# Patient Record
Sex: Male | Born: 1950 | Race: White | Hispanic: No | Marital: Married | Smoking: Current some day smoker
Health system: Southern US, Community
[De-identification: ages and names within clinical notes are randomized; demographics above are authoritative.]

## PROBLEM LIST (undated history)

## (undated) DIAGNOSIS — K5790 Diverticulosis of intestine, part unspecified, without perforation or abscess without bleeding: Secondary | ICD-10-CM

## (undated) DIAGNOSIS — T7840XA Allergy, unspecified, initial encounter: Secondary | ICD-10-CM

## (undated) DIAGNOSIS — I10 Essential (primary) hypertension: Secondary | ICD-10-CM

## (undated) DIAGNOSIS — H269 Unspecified cataract: Secondary | ICD-10-CM

## (undated) DIAGNOSIS — D126 Benign neoplasm of colon, unspecified: Secondary | ICD-10-CM

## (undated) DIAGNOSIS — K219 Gastro-esophageal reflux disease without esophagitis: Secondary | ICD-10-CM

## (undated) DIAGNOSIS — R739 Hyperglycemia, unspecified: Secondary | ICD-10-CM

## (undated) DIAGNOSIS — K222 Esophageal obstruction: Secondary | ICD-10-CM

## (undated) DIAGNOSIS — K449 Diaphragmatic hernia without obstruction or gangrene: Secondary | ICD-10-CM

## (undated) HISTORY — DX: Essential (primary) hypertension: I10

## (undated) HISTORY — PX: CHOLECYSTECTOMY: SHX55

## (undated) HISTORY — PX: RETINAL DETACHMENT SURGERY: SHX105

## (undated) HISTORY — DX: Unspecified cataract: H26.9

## (undated) HISTORY — DX: Gastro-esophageal reflux disease without esophagitis: K21.9

## (undated) HISTORY — DX: Diaphragmatic hernia without obstruction or gangrene: K44.9

## (undated) HISTORY — DX: Allergy, unspecified, initial encounter: T78.40XA

## (undated) HISTORY — PX: COLONOSCOPY W/ POLYPECTOMY: SHX1380

## (undated) HISTORY — DX: Diverticulosis of intestine, part unspecified, without perforation or abscess without bleeding: K57.90

## (undated) HISTORY — DX: Esophageal obstruction: K22.2

## (undated) HISTORY — PX: CATARACT EXTRACTION, BILATERAL: SHX1313

## (undated) HISTORY — DX: Hyperglycemia, unspecified: R73.9

---

## 2000-10-01 ENCOUNTER — Encounter (INDEPENDENT_AMBULATORY_CARE_PROVIDER_SITE_OTHER): Payer: Self-pay | Admitting: Specialist

## 2000-10-01 ENCOUNTER — Other Ambulatory Visit: Admission: RE | Admit: 2000-10-01 | Discharge: 2000-10-01 | Payer: Self-pay | Admitting: Internal Medicine

## 2004-03-09 HISTORY — PX: CHOLECYSTECTOMY, LAPAROSCOPIC: SHX56

## 2004-06-18 ENCOUNTER — Ambulatory Visit: Payer: Self-pay | Admitting: Internal Medicine

## 2004-11-13 ENCOUNTER — Ambulatory Visit: Payer: Self-pay | Admitting: Internal Medicine

## 2004-12-01 ENCOUNTER — Encounter: Admission: RE | Admit: 2004-12-01 | Discharge: 2004-12-01 | Payer: Self-pay | Admitting: Internal Medicine

## 2005-01-01 ENCOUNTER — Encounter (INDEPENDENT_AMBULATORY_CARE_PROVIDER_SITE_OTHER): Payer: Self-pay | Admitting: Specialist

## 2005-01-01 ENCOUNTER — Ambulatory Visit: Admission: RE | Admit: 2005-01-01 | Discharge: 2005-01-01 | Payer: Self-pay | Admitting: Surgery

## 2005-01-13 ENCOUNTER — Ambulatory Visit: Payer: Self-pay | Admitting: Internal Medicine

## 2005-03-09 HISTORY — PX: UPPER GASTROINTESTINAL ENDOSCOPY: SHX188

## 2005-04-07 ENCOUNTER — Ambulatory Visit: Payer: Self-pay | Admitting: Internal Medicine

## 2005-04-21 ENCOUNTER — Ambulatory Visit: Payer: Self-pay | Admitting: Internal Medicine

## 2005-06-09 ENCOUNTER — Ambulatory Visit: Payer: Self-pay | Admitting: Internal Medicine

## 2005-07-13 ENCOUNTER — Ambulatory Visit: Payer: Self-pay | Admitting: Internal Medicine

## 2005-09-04 ENCOUNTER — Ambulatory Visit: Payer: Self-pay | Admitting: Internal Medicine

## 2005-10-06 ENCOUNTER — Ambulatory Visit: Payer: Self-pay | Admitting: Internal Medicine

## 2006-01-08 ENCOUNTER — Ambulatory Visit: Payer: Self-pay | Admitting: Internal Medicine

## 2006-01-25 ENCOUNTER — Ambulatory Visit: Payer: Self-pay | Admitting: Internal Medicine

## 2006-02-12 ENCOUNTER — Ambulatory Visit: Payer: Self-pay | Admitting: Internal Medicine

## 2006-07-25 IMAGING — NM NM HEPATO W/GB/PHARM/[PERSON_NAME]
6 series · 11 of 11 positions shown · non-contrast
Comparison: None.

CLINICAL DATA: Gallstones and abdominal pain.  Possible gallbladder dysfunction. 
 NUCLEAR MEDICINE HEPATOBILIARY SCAN WITH EJECTION FRACTION:
TECHNIQUE: Sequential abdominal images were obtained following intravenous injection of radiopharmaceutical.  Sequential images were continued following oral ingestion of 8 oz. half-and-half, and the gallbladder ejection fraction was calculated.
 Radiopharmaceutical:  5 mCi Xc-ZZm Choletec

[gb hepatobiliary · 1 of 1 slices shown (1 of 6)]
[im 1/1]
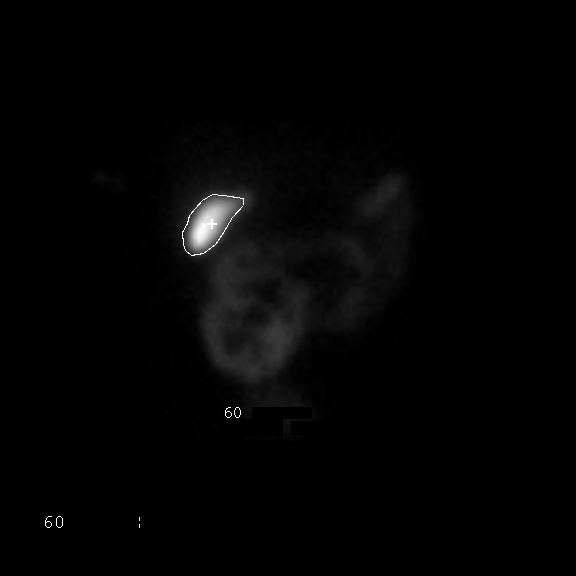

[gb hepatobiliary · 4.66mm/px · 6 of 12 frames shown (2 of 6)]
[frame 2/12]
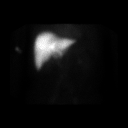
[frame 4/12]
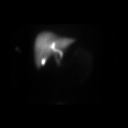
[frame 6/12]
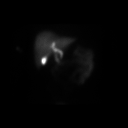
[frame 8/12]
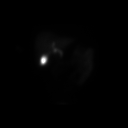
[frame 10/12]
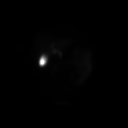
[frame 12/12]
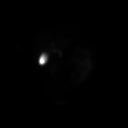

[gb hepatobiliary · 1 of 1 slices shown (3 of 6)]
[im 1/1]
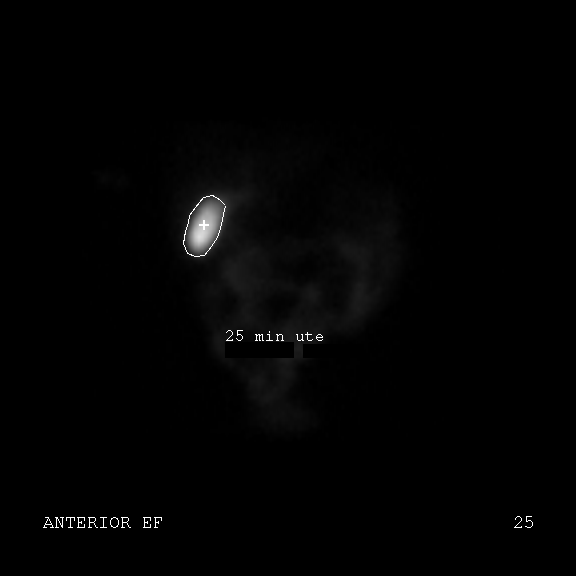

[gb hepatobiliary · 1 of 1 slices shown (4 of 6)]
[im 1/1]
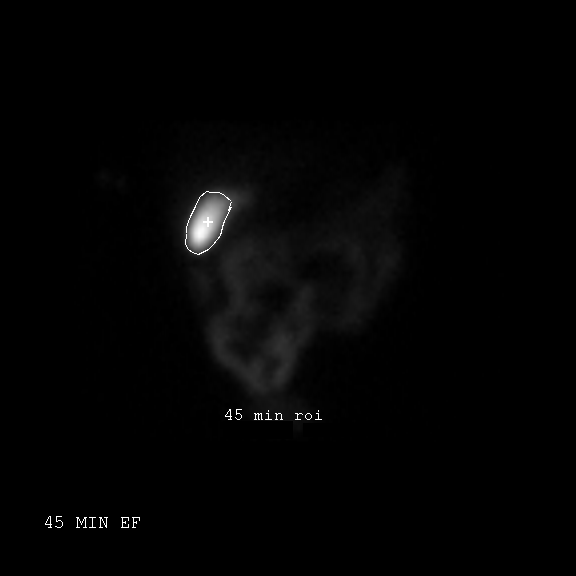

[gb hepatobiliary · 1 of 1 slices shown (5 of 6)]
[im 1/1]
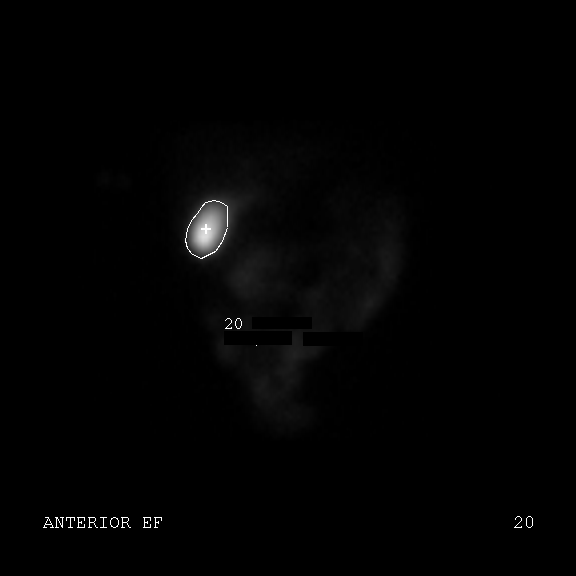

[gb hepatobiliary · 1 of 1 slices shown (6 of 6)]
[im 1/1]
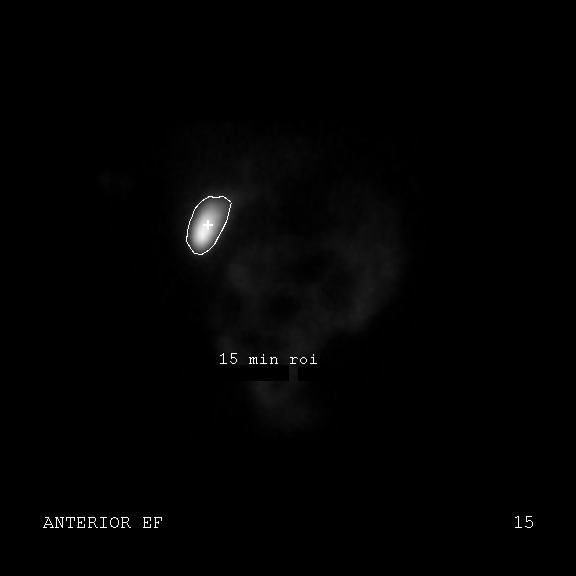

[11 of 11 positions shown; findings below may reference images not displayed]

FINDINGS: There is prompt uptake of tracer by the liver.  Common duct activity is seen at 10 minutes, gallbladder activity at 15 minutes, and bile activity by 20 minutes.  
 Evaluation of ejection fraction after half-and-half administration demonstrates an ejection fraction of 29 percent.
IMPRESSION: 1.  Mildly decreased ejection fraction at 29 percent (lower limits of normal 35 percent).  This suggests a component of chronic cholecystitis. 
 2.  No evidence of acute cholecystitis.

## 2007-07-04 ENCOUNTER — Ambulatory Visit: Payer: Self-pay | Admitting: Internal Medicine

## 2007-07-04 DIAGNOSIS — Z860100 Personal history of colon polyps, unspecified: Secondary | ICD-10-CM | POA: Insufficient documentation

## 2007-07-04 DIAGNOSIS — H268 Other specified cataract: Secondary | ICD-10-CM | POA: Insufficient documentation

## 2007-07-04 DIAGNOSIS — K219 Gastro-esophageal reflux disease without esophagitis: Secondary | ICD-10-CM

## 2007-08-26 ENCOUNTER — Telehealth (INDEPENDENT_AMBULATORY_CARE_PROVIDER_SITE_OTHER): Payer: Self-pay | Admitting: *Deleted

## 2008-06-05 ENCOUNTER — Telehealth: Payer: Self-pay | Admitting: Internal Medicine

## 2008-11-07 DIAGNOSIS — R739 Hyperglycemia, unspecified: Secondary | ICD-10-CM

## 2008-11-07 HISTORY — DX: Hyperglycemia, unspecified: R73.9

## 2008-11-23 ENCOUNTER — Ambulatory Visit: Payer: Self-pay | Admitting: Internal Medicine

## 2008-11-23 DIAGNOSIS — N400 Enlarged prostate without lower urinary tract symptoms: Secondary | ICD-10-CM

## 2008-11-23 DIAGNOSIS — K5730 Diverticulosis of large intestine without perforation or abscess without bleeding: Secondary | ICD-10-CM | POA: Insufficient documentation

## 2008-11-23 DIAGNOSIS — J019 Acute sinusitis, unspecified: Secondary | ICD-10-CM

## 2008-11-28 ENCOUNTER — Encounter (INDEPENDENT_AMBULATORY_CARE_PROVIDER_SITE_OTHER): Payer: Self-pay | Admitting: *Deleted

## 2008-12-03 ENCOUNTER — Encounter (INDEPENDENT_AMBULATORY_CARE_PROVIDER_SITE_OTHER): Payer: Self-pay | Admitting: *Deleted

## 2010-03-30 ENCOUNTER — Encounter: Payer: Self-pay | Admitting: Internal Medicine

## 2010-04-06 LAB — CONVERTED CEMR LAB
ALT: 19 units/L (ref 0–53)
ALT: 20 units/L (ref 0–53)
AST: 23 units/L (ref 0–37)
AST: 25 units/L (ref 0–37)
Albumin: 4.3 g/dL (ref 3.5–5.2)
Albumin: 4.4 g/dL (ref 3.5–5.2)
Alkaline Phosphatase: 40 units/L (ref 39–117)
Alkaline Phosphatase: 43 units/L (ref 39–117)
BUN: 10 mg/dL (ref 6–23)
BUN: 15 mg/dL (ref 6–23)
Basophils Absolute: 0 10*3/uL (ref 0.0–0.1)
Basophils Absolute: 0 10*3/uL (ref 0.0–0.1)
Basophils Relative: 0.1 % (ref 0.0–1.0)
Basophils Relative: 0.3 % (ref 0.0–3.0)
Bilirubin, Direct: 0 mg/dL (ref 0.0–0.3)
Bilirubin, Direct: 0.1 mg/dL (ref 0.0–0.3)
CO2: 27 meq/L (ref 19–32)
CO2: 29 meq/L (ref 19–32)
Calcium: 9.4 mg/dL (ref 8.4–10.5)
Calcium: 9.6 mg/dL (ref 8.4–10.5)
Chloride: 103 meq/L (ref 96–112)
Chloride: 103 meq/L (ref 96–112)
Cholesterol: 171 mg/dL (ref 0–200)
Cholesterol: 205 mg/dL (ref 0–200)
Creatinine, Ser: 1 mg/dL (ref 0.4–1.5)
Creatinine, Ser: 1 mg/dL (ref 0.4–1.5)
Direct LDL: 103.7 mg/dL
Eosinophils Absolute: 0.1 10*3/uL (ref 0.0–0.7)
Eosinophils Absolute: 0.1 10*3/uL (ref 0.0–0.7)
Eosinophils Relative: 1 % (ref 0.0–5.0)
Eosinophils Relative: 1.4 % (ref 0.0–5.0)
GFR calc Af Amer: 100 mL/min
GFR calc non Af Amer: 81.76 mL/min (ref >60)
GFR calc non Af Amer: 82 mL/min
Glucose, Bld: 107 mg/dL — ABNORMAL HIGH (ref 70–99)
Glucose, Bld: 92 mg/dL (ref 70–99)
HCT: 41.3 % (ref 39.0–52.0)
HCT: 42.6 % (ref 39.0–52.0)
HDL: 84 mg/dL (ref >39.00)
HDL: 87.4 mg/dL (ref >39.0)
Hemoglobin: 14.1 g/dL (ref 13.0–17.0)
Hemoglobin: 14.5 g/dL (ref 13.0–17.0)
Hgb A1c MFr Bld: 5.2 % (ref 4.6–6.0)
Hgb A1c MFr Bld: 5.2 % (ref 4.6–6.5)
LDL Cholesterol: 80 mg/dL (ref 0–99)
Lymphocytes Relative: 25.1 % (ref 12.0–46.0)
Lymphocytes Relative: 30 % (ref 12.0–46.0)
Lymphs Abs: 1.5 10*3/uL (ref 0.7–4.0)
MCHC: 34.1 g/dL (ref 30.0–36.0)
MCHC: 34.2 g/dL (ref 30.0–36.0)
MCV: 100.6 fL — ABNORMAL HIGH (ref 78.0–100.0)
MCV: 101.9 fL — ABNORMAL HIGH (ref 78.0–100.0)
Monocytes Absolute: 0.6 10*3/uL (ref 0.1–1.0)
Monocytes Absolute: 0.8 10*3/uL (ref 0.1–1.0)
Monocytes Relative: 10.7 % (ref 3.0–12.0)
Monocytes Relative: 11.9 % (ref 3.0–12.0)
Neutro Abs: 3.6 10*3/uL (ref 1.4–7.7)
Neutro Abs: 3.6 10*3/uL (ref 1.4–7.7)
Neutrophils Relative %: 57 % (ref 43.0–77.0)
Neutrophils Relative %: 62.5 % (ref 43.0–77.0)
PSA: 1.91 ng/mL (ref 0.10–4.00)
PSA: 1.91 ng/mL (ref 0.10–4.00)
Platelets: 267 10*3/uL (ref 150.0–400.0)
Platelets: 290 10*3/uL (ref 150–400)
Potassium: 4.6 meq/L (ref 3.5–5.1)
Potassium: 4.8 meq/L (ref 3.5–5.1)
RBC: 4.11 M/uL — ABNORMAL LOW (ref 4.22–5.81)
RBC: 4.18 M/uL — ABNORMAL LOW (ref 4.22–5.81)
RDW: 11.7 % (ref 11.5–14.6)
RDW: 12 % (ref 11.5–14.6)
Sodium: 136 meq/L (ref 135–145)
Sodium: 136 meq/L (ref 135–145)
TSH: 1.31 microintl units/mL (ref 0.35–5.50)
TSH: 1.62 microintl units/mL (ref 0.35–5.50)
Total Bilirubin: 0.8 mg/dL (ref 0.3–1.2)
Total Bilirubin: 1.2 mg/dL (ref 0.3–1.2)
Total CHOL/HDL Ratio: 2
Total CHOL/HDL Ratio: 2.3
Total Protein: 7.4 g/dL (ref 6.0–8.3)
Total Protein: 7.6 g/dL (ref 6.0–8.3)
Triglycerides: 36 mg/dL (ref 0.0–149.0)
Triglycerides: 55 mg/dL (ref 0–149)
VLDL: 11 mg/dL (ref 0–40)
VLDL: 7.2 mg/dL (ref 0.0–40.0)
WBC: 5.8 10*3/uL (ref 4.5–10.5)
WBC: 6.4 10*3/uL (ref 4.5–10.5)

## 2010-07-25 NOTE — Op Note (Signed)
NAMEBRILEY, Philip Castillo NO.:  [id number]   MEDICAL RECORD NO.:  [id number]          PATIENT TYPE:  OIB   LOCATION:  2550                         FACILITY:  [facility]   PHYSICIAN:  Currie Paris, M.D.DATE OF BIRTH:  1950-09-04   DATE OF PROCEDURE:  01/01/2005  DATE OF DISCHARGE:                                 OPERATIVE REPORT   OFFICE MEDICAL RECORD NUMBER:  [id number].   PREOPERATIVE DIAGNOSIS:  Chronic calculus cholecystitis with biliary colic.   POSTOPERATIVE DIAGNOSIS:  Chronic calculus cholecystitis with biliary colic.   OPERATION:  Laparoscopic cholecystectomy with intraoperative cholangiogram.   SURGEON:  Dr. Jamey Ripa.   ASSISTANT:  Dr. Maple Hudson.   ANESTHESIA:  General endotracheal.   CLINICAL HISTORY:  This is a 60 year old gentleman with increasing biliary  symptoms and history of gallstones and a recent hepatobiliary scan that was  abnormal. He elected to proceed to cholecystectomy.   DESCRIPTION OF PROCEDURE:  The patient was seen in the holding area and he  had no further questions. He was taken to the operating room after  satisfactory general endotracheal anesthesia had been obtained. The abdomen  was prepped and draped. The time-out occurred.   A 0.25% plain Marcaine was used for each incision. Umbilical incision was  made, the fascia opened, and the peritoneal cavity entered under direct  vision. A pursestring was placed, the Hasson introduced, and the abdomen  insufflated to 15 mmHg.   The camera was placed and no gross abnormalities noted in the abdominal  cavity. The patient was placed in reverse Trendelenburg and tilted to the  left. A 10-11 trocar was placed under direct vision in the epigastrium and  two 5 mm trocars placed on direct vision laterally.   The gallbladder was elevated over the liver. The peritoneum on either side  of the triangle of Calot was opened and the cystic duct and cystic artery  identified and dissected out with  a nice window behind both, between the  two, and behind the artery.   With the anatomy clear, I put a clip on the cystic duct at its junction with  the gallbladder and two clips on the cystic artery.   The cystic duct was opened and a Cook catheter introduced. Operative  angiography was done and appeared normal with good filling of the hepatic  ducts, common duct, and duodenum and no filling defects noted.   Cystic duct catheter was removed and three clips placed on the stay side of  the cystic duct. Two additional clips were placed on the cystic artery and  it was divided leaving three clips on the stay side. The gallbladder was  then removed from below to above with coagulation current of the cautery.   Once this was disconnected, I cauterized the bed to make sure everything was  dry. The gallbladder was placed in a bag and brought out through the  umbilical port.   The umbilical port was temporarily occluded and we reinsufflated, did a  final irrigation, and checked for hemostasis. Again, everything appeared to  be dry.   The  lateral ports were removed under direct vision. The pursestring was used  to close the umbilical port with the camera visualizing through the  epigastric port. The abdomen was deflated through the epigastric port and it  was removed. There was a small fascial defect there, so that was closed with  0-Vicryl. The skin was closed with 4-0 Monocryl subcuticular plus Dermabond.   The patient tolerated the procedure well. There were no operative  complications. All counts were correct.      Currie Paris, M.D.  Electronically Signed     CJS/MEDQ  D:  01/01/2005  T:  01/01/2005  Job:  [id number]   cc:   Titus Dubin. Alwyn Ren, M.D. [facility]  [phone] [place]  [place] [address]

## 2011-02-14 ENCOUNTER — Encounter: Payer: Self-pay | Admitting: Internal Medicine

## 2011-04-30 ENCOUNTER — Encounter: Payer: Self-pay | Admitting: Internal Medicine

## 2011-04-30 ENCOUNTER — Ambulatory Visit (INDEPENDENT_AMBULATORY_CARE_PROVIDER_SITE_OTHER): Payer: BC Managed Care – PPO | Admitting: Internal Medicine

## 2011-04-30 VITALS — BP 124/88 | HR 69 | Temp 98.5°F | Resp 12 | Ht 70.0 in | Wt 177.8 lb

## 2011-04-30 DIAGNOSIS — Z0000 Encounter for general adult medical examination without abnormal findings: Secondary | ICD-10-CM

## 2011-04-30 NOTE — Progress Notes (Signed)
  Subjective:    Patient ID: Philip Castillo, male    DOB: 1950/05/14, 61 y.o.   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      Review of Systems Patient reports no  anorexia, weight change, fever ,adenopathy, persistant / recurrent hoarseness, swallowing issues, chest pain,palpitations, edema,persistant / recurrent cough, hemoptysis, dyspnea(rest, exertional, paroxysmal nocturnal), gastrointestinal  bleeding (melena, rectal bleeding), abdominal pain, excessive heart burn, GU symptoms( dysuria, hematuria, pyuria, voiding/incontinence  issues) syncope, focal weakness, memory loss,numbness & tingling, skin or nail changes,depression, anxiety, abnormal bruising/bleeding, or musculoskeletal symptoms/signs.      Objective:   Physical Exam Gen.:  well-nourished in appearance. Alert, appropriate and cooperative throughout exam. Head: Normocephalic without obvious abnormalities; pattern alopecia  Eyes: No corneal or conjunctival inflammation noted. Pupils asymmetric; OD > OS. Ears: External  ear exam reveals no significant lesions or deformities. Canals clear .TMs normal. Hearing is grossly decreased  bilaterally. Nose: External nasal exam reveals no deformity or inflammation. Nasal mucosa are pink and moist. No lesions or exudates noted.  Mouth: Oral mucosa and oropharynx reveal no lesions or exudates. Mild erythema present.Teeth in good repair. Neck: No deformities, masses, or tenderness noted. Range of motion & Thyroid normal Lungs: Normal respiratory effort; chest expands symmetrically. Lungs are clear to auscultation without rales, wheezes, or increased work of breathing. Heart: Normal rate and rhythm. Normal S1 and S2. No gallop, click, or rub.S 4 w/o  murmur. Abdomen: Bowel sounds normal; abdomen soft and nontender. No masses, organomegaly or hernias noted. Genitalia/DRE:  Small varicocele is present on the left. Prostate is 1/2-2 times enlarged without asymmetry, induration, or nodularity.                                                    Musculoskeletal/extremities: No deformity or scoliosis noted of  the thoracic or lumbar spine. No clubbing, cyanosis, edema, or deformity noted. Range of motion  normal .Tone & strength  normal.Joints normal. Nail health  good. Vascular: Carotid, radial artery, dorsalis pedis and  posterior tibial pulses are full and equal. No bruits present. Neurologic: Alert and oriented x3. Deep tendon reflexes symmetrical and normal.          Skin: Intact without suspicious lesions or rashes. Lymph: No cervical, axillary, or inguinal lymphadenopathy present. Psych: Mood and affect are normal. Normally interactive                                                                                         Assessment & Plan:  #1 comprehensive physical exam; no acute findings #2 see Problem List with Assessments & Recommendations Plan: see Orders

## 2011-04-30 NOTE — Patient Instructions (Signed)
Preventive Health Care: Exercise at least 30-45 minutes a day,  3-4 days a week.  Eat a low-fat diet with lots of fruits and vegetables, up to 7-9 servings per day. Consume less than 40 grams of sugar per day from foods & drinks with High Fructose Corn Sugar as # 1,2,3 or # 4 on label. Health Care Power of Attorney & Living Will. Complete if not in place ; these place you in charge of your health care decisions. The triggers for dyspepsia or "heart burn"  include stress; the "aspirin family" ; alcohol; peppermint; and caffeine (coffee, tea, cola, and chocolate). The aspirin family would include aspirin and the nonsteroidal agents such as ibuprofen &  Naproxen. Tylenol would not cause reflux. If having dyspepsia ; food & drink should be avoided for @ least 2 hours before going to bed. Blood Pressure Goal  Ideally is an AVERAGE < 135/85. This AVERAGE should be calculated from @ least 5-7 BP readings taken @ different times of day on different days of week. You should not respond to isolated BP readings , but rather the AVERAGE for that week   Please  schedule fasting Labs : BMET,Lipids, hepatic panel, CBC & dif, TSH, PSA.  PLEASE BRING THESE INSTRUCTIONS TO FOLLOW UP  LAB APPOINTMENT.This will guarantee correct labs are drawn, eliminating need for repeat blood sampling ( needle sticks ! ). Diagnoses /Codes: V70.0

## 2011-05-01 ENCOUNTER — Other Ambulatory Visit: Payer: Self-pay | Admitting: Internal Medicine

## 2011-05-01 DIAGNOSIS — Z0000 Encounter for general adult medical examination without abnormal findings: Secondary | ICD-10-CM

## 2011-05-07 ENCOUNTER — Other Ambulatory Visit (INDEPENDENT_AMBULATORY_CARE_PROVIDER_SITE_OTHER): Payer: BC Managed Care – PPO

## 2011-05-07 DIAGNOSIS — Z0000 Encounter for general adult medical examination without abnormal findings: Secondary | ICD-10-CM

## 2011-05-07 LAB — CBC WITH DIFFERENTIAL/PLATELET
Basophils Absolute: 0 10*3/uL (ref 0.0–0.1)
Basophils Relative: 0.6 % (ref 0.0–3.0)
Eosinophils Absolute: 0 10*3/uL (ref 0.0–0.7)
Hemoglobin: 13.8 g/dL (ref 13.0–17.0)
Lymphocytes Relative: 33.1 % (ref 12.0–46.0)
Lymphs Abs: 1.6 10*3/uL (ref 0.7–4.0)
Monocytes Absolute: 0.6 10*3/uL (ref 0.1–1.0)
Monocytes Relative: 12.5 % — ABNORMAL HIGH (ref 3.0–12.0)
Neutro Abs: 2.5 10*3/uL (ref 1.4–7.7)
Neutrophils Relative %: 52.9 % (ref 43.0–77.0)
RBC: 3.98 Mil/uL — ABNORMAL LOW (ref 4.22–5.81)
RDW: 12.6 % (ref 11.5–14.6)
WBC: 4.7 10*3/uL (ref 4.5–10.5)

## 2011-05-07 LAB — BASIC METABOLIC PANEL
BUN: 14 mg/dL (ref 6–23)
CO2: 28 mEq/L (ref 19–32)
Calcium: 9.6 mg/dL (ref 8.4–10.5)
Creatinine, Ser: 0.9 mg/dL (ref 0.4–1.5)
GFR: 92.74 mL/min (ref >60.00)
Glucose, Bld: 98 mg/dL (ref 70–99)
Potassium: 4.6 mEq/L (ref 3.5–5.1)
Sodium: 137 mEq/L (ref 135–145)

## 2011-05-07 LAB — LIPID PANEL
HDL: 96.3 mg/dL (ref >39.00)
LDL Cholesterol: 89 mg/dL (ref 0–99)
Total CHOL/HDL Ratio: 2
VLDL: 7 mg/dL (ref 0.0–40.0)

## 2011-05-07 LAB — HEPATIC FUNCTION PANEL
AST: 22 U/L (ref 0–37)
Albumin: 4.3 g/dL (ref 3.5–5.2)
Bilirubin, Direct: 0.1 mg/dL (ref 0.0–0.3)
Total Bilirubin: 0.7 mg/dL (ref 0.3–1.2)

## 2011-05-07 LAB — TSH: TSH: 1.88 u[IU]/mL (ref 0.35–5.50)

## 2011-05-07 LAB — PSA: PSA: 2.16 ng/mL (ref 0.10–4.00)

## 2011-12-04 ENCOUNTER — Encounter: Payer: Self-pay | Admitting: Internal Medicine

## 2013-03-22 ENCOUNTER — Encounter: Payer: Self-pay | Admitting: Internal Medicine

## 2013-03-22 ENCOUNTER — Telehealth: Payer: Self-pay | Admitting: *Deleted

## 2013-03-22 ENCOUNTER — Ambulatory Visit (INDEPENDENT_AMBULATORY_CARE_PROVIDER_SITE_OTHER): Payer: BC Managed Care – PPO | Admitting: Internal Medicine

## 2013-03-22 VITALS — BP 158/87 | HR 77 | Temp 98.4°F | Wt 171.6 lb

## 2013-03-22 DIAGNOSIS — K625 Hemorrhage of anus and rectum: Secondary | ICD-10-CM

## 2013-03-22 DIAGNOSIS — Z86010 Personal history of colonic polyps: Secondary | ICD-10-CM

## 2013-03-22 DIAGNOSIS — R109 Unspecified abdominal pain: Secondary | ICD-10-CM

## 2013-03-22 DIAGNOSIS — K5730 Diverticulosis of large intestine without perforation or abscess without bleeding: Secondary | ICD-10-CM

## 2013-03-22 DIAGNOSIS — N429 Disorder of prostate, unspecified: Secondary | ICD-10-CM

## 2013-03-22 NOTE — Progress Notes (Signed)
Pre visit review using our clinic review tool, if applicable. No additional management support is needed unless otherwise documented below in the visit note. 

## 2013-03-22 NOTE — Progress Notes (Signed)
   Subjective:    Patient ID: Philip Castillo, male    DOB: 11-09-1950, 63 y.o.   MRN: [id number]  HPI   He has had intermittent red blood on the tissue over the past year. He does have a past history of fissure in ano.  There has been some rectal discomfort associated with the minor rectal bleeding.  Over the last 7-10 days he's had increasing fatigue. He has noted dark urine and stool which he describes as "pepper specks".  He's also had some left abdomen and flank aching discomfort up to level III on scale of 10.  He has had both hyperplastic and adenomatous polyps. He apparently was due for followup colonoscopy in 2014 but this was not completed as recommended. He also has been diagnosed with diverticulosis;there is no definite history diverticulitis. His mother had Crohn's disease as did his brother.        Review of Systems He specifically denies fever, chills, sweats, or change in weight.  Since he had his gallbladder removed he's had intermittent loose/watery stool.  He denies dysuria, pyuria, or hematuria.  He notes no change in the color or temperature of skin area pain. There is no associated rash.  He denies epistaxis, hemoptysis, or melena.He has no  dysphagia.  He has no abnormal bruising or bleeding.  He has no difficulty stopping bleeding with injury.       Objective:   Physical Exam General appearance is one of good health and nourishment w/o distress.  Eyes: No conjunctival inflammation or scleral icterus is present. Asymmetry of pupils  Oral exam: Dental staining; lips and gums are healthy appearing.There is no oropharyngeal erythema or exudate noted.   Heart:  Normal rate and regular rhythm. S1 and S2 normal without gallop, murmur, click, rub or other extra sounds     Lungs:Chest clear to auscultation; no wheezes, rhonchi,rales ,or rubs present.No increased work of breathing.   Abdomen: bowel sounds normal, soft and non-tender without masses,  organomegaly or hernias noted.  No guarding or rebound . No tenderness over the flanks to percussion  Musculoskeletal: Able to lie flat and sit up without help. Negative straight leg raising bilaterally. Gait normal  Skin:Warm & dry.  Intact without suspicious lesions or rashes ; no jaundice or tenting  Lymphatic: No lymphadenopathy is noted about the head, neck, axilla, or inguinal areas.   Genitourinary/rectal: Slight atrophy right testicle. Varices on the left. Prostate is upper limits of normal size. There is a central vertical ridge in each lobe. Hemoccult testing negative               Assessment & Plan:  #1 rectal bleeding, probable fissure and a no  #2 left abdominal pain; clinically diverticulitis is not present  #3 abnormal prostate  Plan: See orders

## 2013-03-22 NOTE — Patient Instructions (Signed)
Your next office appointment will be determined based upon review of your pending labs. Those instructions will be transmitted to you through My Chart  or by mail if you're not using this system.   Followup as needed for your this acute issue. Please report any significant change in your symptoms.

## 2013-03-23 ENCOUNTER — Other Ambulatory Visit: Payer: Self-pay | Admitting: Internal Medicine

## 2013-03-23 DIAGNOSIS — R972 Elevated prostate specific antigen [PSA]: Secondary | ICD-10-CM

## 2013-03-23 LAB — CBC WITH DIFFERENTIAL/PLATELET
Basophils Absolute: 0 10*3/uL (ref 0.0–0.1)
Basophils Relative: 0.5 % (ref 0.0–3.0)
Eosinophils Absolute: 0.1 10*3/uL (ref 0.0–0.7)
Eosinophils Relative: 0.9 % (ref 0.0–5.0)
HCT: 43 % (ref 39.0–52.0)
Hemoglobin: 15 g/dL (ref 13.0–17.0)
Lymphocytes Relative: 27.1 % (ref 12.0–46.0)
Lymphs Abs: 2.5 10*3/uL (ref 0.7–4.0)
MCHC: 34.9 g/dL (ref 30.0–36.0)
MCV: 99.6 fl (ref 78.0–100.0)
Monocytes Absolute: 1 10*3/uL (ref 0.1–1.0)
Monocytes Relative: 11.1 % (ref 3.0–12.0)
Neutro Abs: 5.5 10*3/uL (ref 1.4–7.7)
Neutrophils Relative %: 60.4 % (ref 43.0–77.0)
Platelets: 317 10*3/uL (ref 150.0–400.0)
RBC: 4.31 Mil/uL (ref 4.22–5.81)
RDW: 12.8 % (ref 11.5–14.6)
WBC: 9.2 10*3/uL (ref 4.5–10.5)

## 2013-03-23 LAB — POCT URINALYSIS DIPSTICK
Bilirubin, UA: NEGATIVE
Blood, UA: NEGATIVE
Glucose, UA: NEGATIVE
Ketones, UA: NEGATIVE
Leukocytes, UA: NEGATIVE
Nitrite, UA: NEGATIVE
Protein, UA: NEGATIVE
Spec Grav, UA: 1.01
Urobilinogen, UA: 0.2
pH, UA: 7

## 2013-03-23 LAB — PSA: PSA: 3.11 ng/mL (ref 0.10–4.00)

## 2013-03-24 NOTE — Telephone Encounter (Signed)
error 

## 2013-04-06 ENCOUNTER — Telehealth: Payer: Self-pay

## 2013-04-06 NOTE — Telephone Encounter (Signed)
Lisinopril 20  Mg  # 30 Minimal Blood Pressure Goal= AVERAGE < 140/90;  Ideal is an AVERAGE < 135/85. This AVERAGE should be calculated from @ least 5-7 BP readings taken @ different times of day on different days of week. You should not respond to isolated BP readings , but rather the AVERAGE for that week .Please bring your  blood pressure cuff to office visits to verify that it is reliable.It  can also be checked against the blood pressure device at the pharmacy. Finger or wrist cuffs are not dependable; an arm cuff is.

## 2013-04-06 NOTE — Telephone Encounter (Signed)
Patient left VM with concerns that his BP was elevated at last OV 158/87 and today when he saw Urologist. 168/88 has been experiencing dizziness. Spoke with patient who is not experiencing any sweats, headache, chest pain or SOB. States that he had an old Lisinopril 20 mg from years back and took it today and it seemed to make his dizziness subside some.  Please advise recommendations.

## 2013-04-07 ENCOUNTER — Other Ambulatory Visit: Payer: Self-pay | Admitting: *Deleted

## 2013-04-07 MED ORDER — LISINOPRIL 20 MG PO TABS: 20.0000 mg | ORAL_TABLET | Freq: Every day | ORAL | Status: DC

## 2013-04-07 NOTE — Telephone Encounter (Signed)
Medication e-scribed to pharmacy. JG//CMA

## 2013-04-08 ENCOUNTER — Telehealth: Payer: Self-pay | Admitting: Internal Medicine

## 2013-04-08 NOTE — Telephone Encounter (Signed)
Relevant patient education assigned to patient using Emmi. ° °

## 2013-04-17 ENCOUNTER — Encounter: Payer: Self-pay | Admitting: Internal Medicine

## 2013-04-17 ENCOUNTER — Ambulatory Visit (INDEPENDENT_AMBULATORY_CARE_PROVIDER_SITE_OTHER): Payer: BC Managed Care – PPO | Admitting: Internal Medicine

## 2013-04-17 VITALS — BP 152/80 | HR 80 | Ht 70.0 in | Wt 168.6 lb

## 2013-04-17 DIAGNOSIS — K219 Gastro-esophageal reflux disease without esophagitis: Secondary | ICD-10-CM

## 2013-04-17 DIAGNOSIS — Z86010 Personal history of colonic polyps: Secondary | ICD-10-CM

## 2013-04-17 DIAGNOSIS — K222 Esophageal obstruction: Secondary | ICD-10-CM

## 2013-04-17 DIAGNOSIS — K625 Hemorrhage of anus and rectum: Secondary | ICD-10-CM

## 2013-04-17 MED ORDER — ESOMEPRAZOLE MAGNESIUM 20 MG PO CPDR: 40.0000 mg | DELAYED_RELEASE_CAPSULE | Freq: Every day | ORAL | Status: AC

## 2013-04-17 MED ORDER — MOVIPREP 100 G PO SOLR: 1.0000 | Freq: Once | ORAL | Status: DC

## 2013-04-17 NOTE — Patient Instructions (Signed)
You have been scheduled for a colonoscopy with propofol. Please follow written instructions given to you at your visit today.  Please pick up your prep kit at the pharmacy within the next 1-3 days. If you use inhalers (even only as needed), please bring them with you on the day of your procedure. Your physician has requested that you go to www.startemmi.com and enter the access code given to you at your visit today. This web site gives a general overview about your procedure. However, you should still follow specific instructions given to you by our office regarding your preparation for the procedure.  Please increase your Nexium from 20 mg daily to 40 mg daily.

## 2013-04-17 NOTE — Progress Notes (Signed)
HISTORY OF PRESENT ILLNESS:  Philip Castillo is a 63 y.o. male past medical history as outlined below. He is sent today by his PCP regarding rectal bleeding and worsening reflux. He has been seen remotely in this office for GERD and adenomatous colon polyps. He was last seen in February 2007 regarding colon polyp surveillance. Marked left-sided diverticulosis but no polyps. Previous colonoscopies in 2002 and 2004 with polyps. He is overdue for surveillance. He presents with complaints of intermittent rectal bleeding as manifested by blood on the tissue and occasionally in the water. Recent evaluation by his PCP revealed negative rectal exam including Hemoccult negative stool. Normal hemoglobin 15.0 on 03/22/2013. Patient does have a history of rectal fissure. Minor stinging recently. No abdominal pain or weight loss. He has a brother with Crohn's. Also been having worsening reflux symptoms recently. However he has not been on PPI until 10 days ago when he started Nexium 20 mg daily. This has been 90% helpful. Symptoms have included regurgitation and pyrosis. No dysphagia. His last upper endoscopy in February 2007 revealed esophageal stricture and hiatal hernia. He also reports increased frequency of bowel movements in the morning. No change for him.  REVIEW OF SYSTEMS:  All non-GI ROS negative except for sinus and allergy, fatigue, hearing problems, visual change, dizziness  Past Medical History  Diagnosis Date  . Allergy     seasonal  . GERD (gastroesophageal reflux disease)     stressinduced  . Diverticulosis   . Hyperglycemia 11/2008    FBS 107  . Hiatal hernia   . Esophageal stricture     Past Surgical History  Procedure Laterality Date  . Retinal detachment surgery  03/17/11 & 04/16/11     X2   . Cholecystectomy, laparoscopic  2006  . Colonoscopy w/ polypectomy  2002 ; 2004 & 2009    Dr Henrene Pastor; tubular adenoma & hyperplastic polyp  . Cataract extraction, bilateral    . Upper  gastrointestinal endoscopy  2007    hiatal hernia; stricture     Social History Philip Castillo  reports that he has been smoking Cigars.  He has never used smokeless tobacco. He reports that he drinks about 7.2 ounces of alcohol per week. He reports that he does not use illicit drugs.  family history includes Crohn's disease in his brother and mother; Dementia in his brother; Heart disease in his father and mother. There is no history of Cancer, Stroke, or Diabetes.  No Known Allergies     PHYSICAL EXAMINATION: Vital signs: BP 152/80  Pulse 80  Ht 5\' 10"  (1.778 m)  Wt 168 lb 9.6 oz (76.476 kg)  BMI 24.19 kg/m2  Constitutional: generally well-appearing, no acute distress Psychiatric: alert and oriented x3, cooperative Eyes: extraocular movements intact, anicteric, conjunctiva pink Mouth: oral pharynx moist, no lesions Neck: supple no lymphadenopathy Cardiovascular: heart regular rate and rhythm, no murmur Lungs: clear to auscultation bilaterally Abdomen: soft, nontender, nondistended, no obvious ascites, no peritoneal signs, normal bowel sounds, no organomegaly Rectal: Negative recent exam with Dr. Linna Darner. Not repeated Extremities: no lower extremity edema bilaterally Skin: no lesions on visible extremities Neuro: No focal deficits.   ASSESSMENT:  #1. Minor intermittent rectal bleeding. Etiology unclear, though suspect benign anorectal pathology; negative recent rectal exam and normal hemoglobin #2. History of adenomatous colon polyps. Last colonoscopy 2007. Overdue for surveillance #3. GERD. Active symptoms of PPI #4. Peptic stricture. Currently asymptomatic   PLAN:  #1. Reflux precautions #2. Increase Nexium to 40 mg daily #3.  Schedule surveillance colonoscopy.The nature of the procedure, as well as the risks, benefits, and alternatives were carefully and thoroughly reviewed with the patient. Ample time for discussion and questions allowed. The patient understood, was  satisfied, and agreed to proceed. Movi prep prescribed. Patient instructed on its use

## 2013-04-18 ENCOUNTER — Encounter: Payer: Self-pay | Admitting: Internal Medicine

## 2013-04-21 ENCOUNTER — Inpatient Hospital Stay (HOSPITAL_COMMUNITY): Payer: BC Managed Care – PPO | Admitting: Certified Registered Nurse Anesthetist

## 2013-04-21 ENCOUNTER — Ambulatory Visit (AMBULATORY_SURGERY_CENTER): Payer: BC Managed Care – PPO | Admitting: Internal Medicine

## 2013-04-21 ENCOUNTER — Inpatient Hospital Stay (HOSPITAL_COMMUNITY)
Admission: EM | Admit: 2013-04-21 | Discharge: 2013-04-27 | DRG: 907 | Disposition: A | Discharge status: Home / Self Care | Payer: BC Managed Care – PPO | Attending: General Surgery | Admitting: General Surgery

## 2013-04-21 ENCOUNTER — Emergency Department (HOSPITAL_COMMUNITY): Payer: BC Managed Care – PPO

## 2013-04-21 ENCOUNTER — Encounter (HOSPITAL_COMMUNITY): Admission: EM | Disposition: A | Discharge status: Home / Self Care | Payer: Self-pay | Source: Home / Self Care

## 2013-04-21 ENCOUNTER — Encounter (HOSPITAL_COMMUNITY): Payer: BC Managed Care – PPO | Admitting: Certified Registered Nurse Anesthetist

## 2013-04-21 ENCOUNTER — Encounter: Payer: Self-pay | Admitting: Internal Medicine

## 2013-04-21 ENCOUNTER — Encounter (HOSPITAL_COMMUNITY): Payer: Self-pay | Admitting: Emergency Medicine

## 2013-04-21 VITALS — BP 126/84 | HR 61 | Temp 97.3°F | Resp 17 | Ht 70.0 in | Wt 168.0 lb

## 2013-04-21 DIAGNOSIS — Z87891 Personal history of nicotine dependence: Secondary | ICD-10-CM

## 2013-04-21 DIAGNOSIS — I10 Essential (primary) hypertension: Secondary | ICD-10-CM | POA: Diagnosis present

## 2013-04-21 DIAGNOSIS — K5730 Diverticulosis of large intestine without perforation or abscess without bleeding: Secondary | ICD-10-CM | POA: Diagnosis present

## 2013-04-21 DIAGNOSIS — IMO0002 Reserved for concepts with insufficient information to code with codable children: Principal | ICD-10-CM | POA: Diagnosis present

## 2013-04-21 DIAGNOSIS — K625 Hemorrhage of anus and rectum: Secondary | ICD-10-CM

## 2013-04-21 DIAGNOSIS — K668 Other specified disorders of peritoneum: Secondary | ICD-10-CM

## 2013-04-21 DIAGNOSIS — Z860100 Personal history of colon polyps, unspecified: Secondary | ICD-10-CM

## 2013-04-21 DIAGNOSIS — K631 Perforation of intestine (nontraumatic): Secondary | ICD-10-CM

## 2013-04-21 DIAGNOSIS — Z79899 Other long term (current) drug therapy: Secondary | ICD-10-CM

## 2013-04-21 DIAGNOSIS — K661 Hemoperitoneum: Secondary | ICD-10-CM | POA: Diagnosis present

## 2013-04-21 DIAGNOSIS — K222 Esophageal obstruction: Secondary | ICD-10-CM | POA: Diagnosis present

## 2013-04-21 DIAGNOSIS — K219 Gastro-esophageal reflux disease without esophagitis: Secondary | ICD-10-CM | POA: Diagnosis present

## 2013-04-21 DIAGNOSIS — K449 Diaphragmatic hernia without obstruction or gangrene: Secondary | ICD-10-CM | POA: Diagnosis present

## 2013-04-21 DIAGNOSIS — Z7982 Long term (current) use of aspirin: Secondary | ICD-10-CM

## 2013-04-21 DIAGNOSIS — Z8249 Family history of ischemic heart disease and other diseases of the circulatory system: Secondary | ICD-10-CM

## 2013-04-21 HISTORY — PX: LAPAROTOMY: SHX154

## 2013-04-21 HISTORY — PX: EXPLORATORY LAPAROTOMY: SUR591

## 2013-04-21 HISTORY — DX: Benign neoplasm of colon, unspecified: D12.6

## 2013-04-21 LAB — BASIC METABOLIC PANEL
BUN: 8 mg/dL (ref 6–23)
CO2: 17 meq/L — AB (ref 19–32)
CREATININE: 0.68 mg/dL (ref 0.50–1.35)
Calcium: 8.3 mg/dL — ABNORMAL LOW (ref 8.4–10.5)
Chloride: 98 mEq/L (ref 96–112)
GFR calc Af Amer: >90 mL/min (ref >90)
GFR calc non Af Amer: >90 mL/min (ref >90)
Glucose, Bld: 189 mg/dL — ABNORMAL HIGH (ref 70–99)
Potassium: 4.1 mEq/L (ref 3.7–5.3)
Sodium: 131 mEq/L — ABNORMAL LOW (ref 137–147)

## 2013-04-21 LAB — HEPATIC FUNCTION PANEL
ALT: 19 U/L (ref 0–53)
AST: 25 U/L (ref 0–37)
Albumin: 3.9 g/dL (ref 3.5–5.2)
Alkaline Phosphatase: 47 U/L (ref 39–117)
TOTAL PROTEIN: 6.6 g/dL (ref 6.0–8.3)
Total Bilirubin: 0.3 mg/dL (ref 0.3–1.2)

## 2013-04-21 LAB — CBC WITH DIFFERENTIAL/PLATELET
Basophils Absolute: 0 10*3/uL (ref 0.0–0.1)
Basophils Relative: 0 % (ref 0–1)
Eosinophils Absolute: 0 10*3/uL (ref 0.0–0.7)
Eosinophils Relative: 0 % (ref 0–5)
HEMATOCRIT: 38.9 % — AB (ref 39.0–52.0)
HEMOGLOBIN: 13.8 g/dL (ref 13.0–17.0)
LYMPHS PCT: 20 % (ref 12–46)
Lymphs Abs: 2.3 10*3/uL (ref 0.7–4.0)
MCH: 33.9 pg (ref 26.0–34.0)
MCHC: 35.5 g/dL (ref 30.0–36.0)
MCV: 95.6 fL (ref 78.0–100.0)
MONO ABS: 0.7 10*3/uL (ref 0.1–1.0)
MONOS PCT: 6 % (ref 3–12)
NEUTROS ABS: 8.7 10*3/uL — AB (ref 1.7–7.7)
Neutrophils Relative %: 74 % (ref 43–77)
Platelets: 375 10*3/uL (ref 150–400)
RBC: 4.07 MIL/uL — ABNORMAL LOW (ref 4.22–5.81)
RDW: 12.1 % (ref 11.5–15.5)
WBC: 11.7 10*3/uL — AB (ref 4.0–10.5)

## 2013-04-21 LAB — PROTIME-INR
INR: 1.12 (ref 0.00–1.49)
Prothrombin Time: 14.2 seconds (ref 11.6–15.2)

## 2013-04-21 LAB — TYPE AND SCREEN
ABO/RH(D): A NEG
Antibody Screen: NEGATIVE

## 2013-04-21 LAB — CG4 I-STAT (LACTIC ACID): LACTIC ACID, VENOUS: 1.56 mmol/L (ref 0.5–2.2)

## 2013-04-21 LAB — APTT: aPTT: 25 seconds (ref 24–37)

## 2013-04-21 SURGERY — LAPAROTOMY, EXPLORATORY
Anesthesia: General | Site: Abdomen

## 2013-04-21 MED ORDER — SODIUM CHLORIDE 0.9 % IV SOLN: 500.0000 mL | INTRAVENOUS | Status: DC

## 2013-04-21 MED ORDER — PROMETHAZINE HCL 25 MG/ML IJ SOLN: 6.2500 mg | INTRAMUSCULAR | Status: DC | PRN

## 2013-04-21 MED ORDER — ONDANSETRON HCL 4 MG PO TABS: 4.0000 mg | ORAL_TABLET | Freq: Four times a day (QID) | ORAL | Status: DC | PRN

## 2013-04-21 MED ORDER — 0.9 % SODIUM CHLORIDE (POUR BTL) OPTIME
TOPICAL | Status: DC | PRN
  Administered 2013-04-21: 2000 mL

## 2013-04-21 MED ORDER — GLYCOPYRROLATE 0.2 MG/ML IJ SOLN
INTRAMUSCULAR | Status: AC
  Filled 2013-04-21: qty 3

## 2013-04-21 MED ORDER — ROCURONIUM BROMIDE 100 MG/10ML IV SOLN
INTRAVENOUS | Status: AC
  Filled 2013-04-21: qty 1

## 2013-04-21 MED ORDER — EPHEDRINE SULFATE 50 MG/ML IJ SOLN
INTRAMUSCULAR | Status: AC
  Filled 2013-04-21: qty 1

## 2013-04-21 MED ORDER — SODIUM CHLORIDE 0.9 % IV BOLUS (SEPSIS)
1000.0000 mL | Freq: Once | INTRAVENOUS | Status: AC
  Administered 2013-04-21: 1000 mL via INTRAVENOUS

## 2013-04-21 MED ORDER — PHENYLEPHRINE HCL 10 MG/ML IJ SOLN
INTRAMUSCULAR | Status: AC
  Filled 2013-04-21: qty 1

## 2013-04-21 MED ORDER — PHENYLEPHRINE 40 MCG/ML (10ML) SYRINGE FOR IV PUSH (FOR BLOOD PRESSURE SUPPORT)
PREFILLED_SYRINGE | INTRAVENOUS | Status: AC
  Filled 2013-04-21: qty 10

## 2013-04-21 MED ORDER — HYDROMORPHONE HCL PF 1 MG/ML IJ SOLN
1.0000 mg | Freq: Once | INTRAMUSCULAR | Status: AC
  Administered 2013-04-21: 1 mg via INTRAVENOUS
  Filled 2013-04-21: qty 1

## 2013-04-21 MED ORDER — HEPARIN SODIUM (PORCINE) 5000 UNIT/ML IJ SOLN
5000.0000 [IU] | Freq: Three times a day (TID) | INTRAMUSCULAR | Status: DC
  Administered 2013-04-22 – 2013-04-27 (×16): 5000 [IU] via SUBCUTANEOUS
  Filled 2013-04-21 (×19): qty 1

## 2013-04-21 MED ORDER — PROPOFOL 10 MG/ML IV BOLUS
INTRAVENOUS | Status: DC | PRN
  Administered 2013-04-21: 100 mg via INTRAVENOUS

## 2013-04-21 MED ORDER — SUCCINYLCHOLINE CHLORIDE 20 MG/ML IJ SOLN
INTRAMUSCULAR | Status: DC | PRN
  Administered 2013-04-21: 100 mg via INTRAVENOUS

## 2013-04-21 MED ORDER — LACTATED RINGERS IV SOLN: INTRAVENOUS | Status: DC

## 2013-04-21 MED ORDER — MIDAZOLAM HCL 5 MG/5ML IJ SOLN
INTRAMUSCULAR | Status: DC | PRN
  Administered 2013-04-21: 2 mg via INTRAVENOUS

## 2013-04-21 MED ORDER — LIDOCAINE HCL (CARDIAC) 20 MG/ML IV SOLN
INTRAVENOUS | Status: DC | PRN
  Administered 2013-04-21: 100 mg via INTRAVENOUS

## 2013-04-21 MED ORDER — HYDROMORPHONE HCL PF 1 MG/ML IJ SOLN
INTRAMUSCULAR | Status: DC | PRN
  Administered 2013-04-21 (×2): 0.5 mg via INTRAVENOUS
  Administered 2013-04-21: 1 mg via INTRAVENOUS

## 2013-04-21 MED ORDER — PROPOFOL 10 MG/ML IV BOLUS
INTRAVENOUS | Status: AC
  Filled 2013-04-21: qty 20

## 2013-04-21 MED ORDER — PIPERACILLIN-TAZOBACTAM 3.375 G IVPB
3.3750 g | Freq: Three times a day (TID) | INTRAVENOUS | Status: DC
  Administered 2013-04-21 – 2013-04-27 (×17): 3.375 g via INTRAVENOUS
  Filled 2013-04-21 (×18): qty 50

## 2013-04-21 MED ORDER — PHENYLEPHRINE HCL 10 MG/ML IJ SOLN
INTRAMUSCULAR | Status: DC | PRN
  Administered 2013-04-21 (×2): 80 ug via INTRAVENOUS

## 2013-04-21 MED ORDER — NEOSTIGMINE METHYLSULFATE 1 MG/ML IJ SOLN
INTRAMUSCULAR | Status: AC
  Filled 2013-04-21: qty 10

## 2013-04-21 MED ORDER — ESMOLOL HCL 10 MG/ML IV SOLN
INTRAVENOUS | Status: AC
  Filled 2013-04-21: qty 10

## 2013-04-21 MED ORDER — ROCURONIUM BROMIDE 100 MG/10ML IV SOLN
INTRAVENOUS | Status: DC | PRN
  Administered 2013-04-21: 10 mg via INTRAVENOUS
  Administered 2013-04-21: 5 mg via INTRAVENOUS
  Administered 2013-04-21: 10 mg via INTRAVENOUS
  Administered 2013-04-21: 40 mg via INTRAVENOUS

## 2013-04-21 MED ORDER — LACTATED RINGERS IV SOLN
INTRAVENOUS | Status: DC | PRN
  Administered 2013-04-21 (×4): via INTRAVENOUS

## 2013-04-21 MED ORDER — ONDANSETRON HCL 4 MG/2ML IJ SOLN
INTRAMUSCULAR | Status: AC
  Filled 2013-04-21: qty 2

## 2013-04-21 MED ORDER — FENTANYL CITRATE 0.05 MG/ML IJ SOLN
50.0000 ug | Freq: Once | INTRAMUSCULAR | Status: DC
Start: 2013-04-21 — End: 2013-04-22

## 2013-04-21 MED ORDER — GLYCOPYRROLATE 0.2 MG/ML IJ SOLN
INTRAMUSCULAR | Status: DC | PRN
  Administered 2013-04-21: 0.6 mg via INTRAVENOUS

## 2013-04-21 MED ORDER — PIPERACILLIN-TAZOBACTAM 3.375 G IVPB 30 MIN
3.3750 g | Freq: Once | INTRAVENOUS | Status: AC
  Administered 2013-04-21: 3.375 g via INTRAVENOUS
  Filled 2013-04-21: qty 50

## 2013-04-21 MED ORDER — SODIUM CHLORIDE 0.9 % IJ SOLN
INTRAMUSCULAR | Status: AC
  Filled 2013-04-21: qty 10

## 2013-04-21 MED ORDER — FENTANYL CITRATE 0.05 MG/ML IJ SOLN
INTRAMUSCULAR | Status: DC | PRN
Start: 2013-04-21 — End: 2013-04-21
  Administered 2013-04-21 (×5): 50 ug via INTRAVENOUS

## 2013-04-21 MED ORDER — DEXAMETHASONE SODIUM PHOSPHATE 10 MG/ML IJ SOLN
INTRAMUSCULAR | Status: DC | PRN
  Administered 2013-04-21: 10 mg via INTRAVENOUS

## 2013-04-21 MED ORDER — HYDROMORPHONE HCL PF 1 MG/ML IJ SOLN
INTRAMUSCULAR | Status: AC
  Filled 2013-04-21: qty 2

## 2013-04-21 MED ORDER — HYDROMORPHONE HCL PF 2 MG/ML IJ SOLN
INTRAMUSCULAR | Status: AC
  Filled 2013-04-21: qty 1

## 2013-04-21 MED ORDER — SUCCINYLCHOLINE CHLORIDE 20 MG/ML IJ SOLN
INTRAMUSCULAR | Status: AC
  Filled 2013-04-21: qty 1

## 2013-04-21 MED ORDER — ONDANSETRON HCL 4 MG/2ML IJ SOLN
4.0000 mg | Freq: Four times a day (QID) | INTRAMUSCULAR | Status: DC | PRN
  Administered 2013-04-26: 4 mg via INTRAVENOUS
  Filled 2013-04-21: qty 2

## 2013-04-21 MED ORDER — LIDOCAINE HCL (CARDIAC) 20 MG/ML IV SOLN
INTRAVENOUS | Status: AC
  Filled 2013-04-21: qty 5

## 2013-04-21 MED ORDER — PHENYLEPHRINE HCL 10 MG/ML IJ SOLN
10.0000 mg | INTRAVENOUS | Status: DC | PRN
  Administered 2013-04-21: 50 ug/min via INTRAVENOUS

## 2013-04-21 MED ORDER — HYDROMORPHONE HCL PF 1 MG/ML IJ SOLN
0.2500 mg | INTRAMUSCULAR | Status: DC | PRN
  Administered 2013-04-21 (×3): 0.5 mg via INTRAVENOUS

## 2013-04-21 MED ORDER — FENTANYL CITRATE 0.05 MG/ML IJ SOLN
INTRAMUSCULAR | Status: AC
  Filled 2013-04-21: qty 5

## 2013-04-21 MED ORDER — ONDANSETRON HCL 4 MG/2ML IJ SOLN
INTRAMUSCULAR | Status: DC | PRN
  Administered 2013-04-21: 4 mg via INTRAVENOUS

## 2013-04-21 MED ORDER — NEOSTIGMINE METHYLSULFATE 1 MG/ML IJ SOLN
INTRAMUSCULAR | Status: DC | PRN
  Administered 2013-04-21: 4 mg via INTRAVENOUS

## 2013-04-21 MED ORDER — MIDAZOLAM HCL 2 MG/2ML IJ SOLN
INTRAMUSCULAR | Status: AC
  Filled 2013-04-21: qty 2

## 2013-04-21 SURGICAL SUPPLY — 47 items
APPLICATOR COTTON TIP 6IN STRL (MISCELLANEOUS) IMPLANT
BLADE EXTENDED COATED 6.5IN (ELECTRODE) IMPLANT
BLADE HEX COATED 2.75 (ELECTRODE) ×3 IMPLANT
CANISTER SUCTION 2500CC (MISCELLANEOUS) IMPLANT
COVER MAYO STAND STRL (DRAPES) ×3 IMPLANT
COVER SURGICAL LIGHT HANDLE (MISCELLANEOUS) ×6 IMPLANT
DRAIN CHANNEL 19F RND (DRAIN) IMPLANT
DRAPE LAPAROSCOPIC ABDOMINAL (DRAPES) ×3 IMPLANT
DRAPE WARM FLUID 44X44 (DRAPE) ×3 IMPLANT
DRSG OPSITE POSTOP 4X8 (GAUZE/BANDAGES/DRESSINGS) ×3 IMPLANT
ELECT REM PT RETURN 9FT ADLT (ELECTROSURGICAL) ×3
ELECTRODE REM PT RTRN 9FT ADLT (ELECTROSURGICAL) ×1 IMPLANT
EVACUATOR SILICONE 100CC (DRAIN) IMPLANT
GLOVE BIOGEL M 8.0 STRL (GLOVE) ×6 IMPLANT
GLOVE BIOGEL PI IND STRL 7.5 (GLOVE) ×2 IMPLANT
GLOVE BIOGEL PI INDICATOR 7.5 (GLOVE) ×4
GLOVE SURG SS PI 7.0 STRL IVOR (GLOVE) ×6 IMPLANT
GLOVE SURG SS PI 7.5 STRL IVOR (GLOVE) ×6 IMPLANT
GOWN STRL REUS W/TWL 2XL LVL3 (GOWN DISPOSABLE) ×6 IMPLANT
GOWN STRL REUS W/TWL XL LVL3 (GOWN DISPOSABLE) ×6 IMPLANT
KIT BASIN OR (CUSTOM PROCEDURE TRAY) ×3 IMPLANT
LIGASURE IMPACT 36 18CM CVD LR (INSTRUMENTS) ×3 IMPLANT
MANIFOLD NEPTUNE II (INSTRUMENTS) ×3 IMPLANT
NS IRRIG 1000ML POUR BTL (IV SOLUTION) ×18 IMPLANT
PACK GENERAL/GYN (CUSTOM PROCEDURE TRAY) ×3 IMPLANT
RELOAD PROXIMATE 75MM BLUE (ENDOMECHANICALS) ×12 IMPLANT
SPONGE GAUZE 4X4 12PLY (GAUZE/BANDAGES/DRESSINGS) ×3 IMPLANT
SPONGE LAP 18X18 X RAY DECT (DISPOSABLE) ×6 IMPLANT
STAPLER PROXIMATE 75MM BLUE (STAPLE) ×3 IMPLANT
STAPLER VISISTAT 35W (STAPLE) ×3 IMPLANT
SUCTION POOLE TIP (SUCTIONS) ×3 IMPLANT
SUT NOVA 1 T20/GS 25DT (SUTURE) ×12 IMPLANT
SUT PDS AB 1 CTX 36 (SUTURE) IMPLANT
SUT PDS AB 4-0 SH 27 (SUTURE) ×6 IMPLANT
SUT SILK 2 0 (SUTURE) ×2
SUT SILK 2 0 SH CR/8 (SUTURE) ×3 IMPLANT
SUT SILK 2-0 18XBRD TIE 12 (SUTURE) ×1 IMPLANT
SUT SILK 3 0 (SUTURE) ×2
SUT SILK 3 0 SH CR/8 (SUTURE) ×9 IMPLANT
SUT SILK 3-0 18XBRD TIE 12 (SUTURE) ×1 IMPLANT
SUT VIC AB 3-0 SH 18 (SUTURE) IMPLANT
SUT VICRYL 2 0 18  UND BR (SUTURE)
SUT VICRYL 2 0 18 UND BR (SUTURE) IMPLANT
TOWEL OR 17X26 10 PK STRL BLUE (TOWEL DISPOSABLE) ×6 IMPLANT
TOWEL OR NON WOVEN STRL DISP B (DISPOSABLE) ×3 IMPLANT
TRAY FOLEY CATH 14FRSI W/METER (CATHETERS) IMPLANT
TRAY FOLEY CATH 16FRSI W/METER (SET/KITS/TRAYS/PACK) ×3 IMPLANT

## 2013-04-21 NOTE — Progress Notes (Signed)
Received report from [place].  CRNA stated that patient's right side was distended and Dr Henrene Pastor aware and concerned.  I assess patient whose facial expression showed severe pain.  Patient was grimacing, could not sit still, had trouble breathing and closed hands very tight with knuckles white.  Oxygen applied at 4 to get patient's sats to 91-93%. Patient placed in trendlinburg position and rotated from left lying side and right lying side.  No relief.  Spoke with Dr Henrene Pastor at  approx. 1410 to informed him of patient's current condition, which is unchanged and getting worse.  Whole ABD is now distended. No order given.  Levsion 2 tablets given and rectal tube was inserted with no relief.  Continued measures of rotating patient side to side.  Informed Dr Henrene Pastor at approx 1430 of measures taken.  No orders given.  Dr Henrene Pastor came to recovery to assess patient.  Orders given to send patient to [facility] ER for further evaluation.

## 2013-04-21 NOTE — Anesthesia Preprocedure Evaluation (Addendum)
Anesthesia Evaluation  Patient identified by MRN, date of birth, ID band Patient awake  General Assessment Comment:Perforated post colonoscopy   Reviewed: Allergy & Precautions, H&P , NPO status , Patient's Chart, lab work & pertinent test results  Airway Mallampati: II TM Distance: >3 FB Neck ROM: Full    Dental no notable dental hx.    Pulmonary Current Smoker,  breath sounds clear to auscultation  + decreased breath sounds      Cardiovascular hypertension, Pt. on medications Rhythm:Regular Rate:Tachycardia     Neuro/Psych negative neurological ROS  negative psych ROS   GI/Hepatic Neg liver ROS, GERD-  Medicated,  Endo/Other  negative endocrine ROS  Renal/GU negative Renal ROS  negative genitourinary   Musculoskeletal negative musculoskeletal ROS (+)   Abdominal (+)  Abdomen: rigid.    Peds negative pediatric ROS (+)  Hematology negative hematology ROS (+)   Anesthesia Other Findings   Reproductive/Obstetrics negative OB ROS                         Anesthesia Physical Anesthesia Plan  ASA: III and emergent  Anesthesia Plan: General   Post-op Pain Management:    Induction: Intravenous, Rapid sequence and Cricoid pressure planned  Airway Management Planned: Oral ETT  Additional Equipment:   Intra-op Plan:   Post-operative Plan: Extubation in OR  Informed Consent:   Dental advisory given  Plan Discussed with: CRNA and Surgeon  Anesthesia Plan Comments:         Anesthesia Quick Evaluation

## 2013-04-21 NOTE — H&P (View-Only) (Signed)
Reason for Consult: Possible colon perforation during colonoscopy Referring Physician: Dr. Salvadore Dom Yavuz Philip Castillo is an 63 y.o. male.  HPI: 63 y/o undergoing diagnostic colonoscopy for some bleeding, apparently his H/H was also down, hx of diverticulosis, but no diverticulitis. Prep was good and he developed pain during the procedure.  He has significant diverticular disease in the sigmoid and this area was strictured .  There is also concern that because of the diverticular disease it could be clear over at the cecum.  He was transported to the ER by ambulance from the Endoscopy center.  In the ER here his abdomen is severely distended and painful.  He is tachycardic and diaphoretic with 10/10 pain.  Single view of the chest shows:  There is very prominent free air in the abdomen. There is slight  atelectasis at the left lung base. Heart size and vascularity are  normal. No acute osseous abnormality.  He is being taken directly to the OR for exploratory laparotomy.     Past Medical History  Diagnosis Date   Tobacco use since age 77    EOTH  2-4 beers per day after work    . Hypertension      Hx of  Esophageal stricture . Hiatal hernia       . Diverticulosis history     . GERD (gastroesophageal reflux disease)     stressinduced  . Hyperglycemia 11/2008    FBS 107  . Cataract     bil removed   Allergy    seasonal       Past Surgical History  Procedure Laterality Date  . Retinal detachment surgery  03/17/11 & 04/16/11     X2   . Cholecystectomy, laparoscopic  2006  . Colonoscopy w/ polypectomy  2002 ; 2004 & 2009    Dr Henrene Pastor; tubular adenoma & hyperplastic polyp  . Cataract extraction, bilateral    . Upper gastrointestinal endoscopy  2007    hiatal hernia; stricture   . Cholecystectomy      Family History  Problem Relation Age of Onset  . Heart disease Mother     CHF  . Crohn's disease Mother   . Heart disease Father     MI @ 37  . Cancer Neg Hx   . Stroke Neg Hx   .  Colon cancer Neg Hx   . Esophageal cancer Neg Hx   . Rectal cancer Neg Hx   . Stomach cancer Neg Hx   . Dementia Brother     Alsheimer's  . Crohn's disease Brother     Social History:  reports that he has been smoking Cigars.  He has never used smokeless tobacco. He reports that he drinks about 7.2 ounces of alcohol per week. He reports that he does not use illicit drugs.  Allergies: No Known Allergies  Medications:  Prior to Admission:  (Not in a hospital admission) Scheduled:  Continuous: . piperacillin-tazobactam     PRN: Anti-infectives   Start     Dose/Rate Route Frequency Ordered Stop   04/21/13 1530  piperacillin-tazobactam (ZOSYN) IVPB 3.375 g     3.375 g 100 mL/hr over 30 Minutes Intravenous  Once 04/21/13 1515        No results found for this or any previous visit (from the past 5 hour(s)).  Dg Chest Portable 1 View  04/21/2013   CLINICAL DATA:  Distended abdomen after colonoscopy.  EXAM: PORTABLE CHEST - 1 VIEW  COMPARISON:  None.  FINDINGS: There  is very prominent free air in the abdomen. There is slight atelectasis at the left lung base. Heart size and vascularity are normal. No acute osseous abnormality.  IMPRESSION: Extensive free air in the abdomen.  Critical Value/emergent results were called by telephone at the time of interpretation on 04/21/2013 at 3:38 PM to Dr. Sherwood Gambler , who verbally acknowledged these results.   Electronically Signed   By: Rozetta Nunnery M.D.   On: 04/21/2013 15:38    Review of Systems  Unable to perform ROS: medical condition   SpO2 94.00%. Physical Exam  Constitutional: He is oriented to person, place, and time. He appears well-developed and well-nourished. He appears distressed.  Looks older than stated age. SpO2 94% Orient RR 47 BP 155/110 HR 150's   HENT:  Head: Normocephalic and atraumatic.  Nose: Nose normal.  Eyes: Conjunctivae are normal. Pupils are equal, round, and reactive to light. Right eye exhibits discharge.  Left eye exhibits no discharge.  Neck: Normal range of motion. Neck supple. No tracheal deviation present. No thyromegaly present.  Cardiovascular: Normal heart sounds and intact distal pulses.   No murmur heard. Looks like Sinus tachycardia on telem  EKG pending  Respiratory: No respiratory distress. He has no wheezes. He has no rales. He exhibits no tenderness.  RR 47 No distress, just allot of pain  GI: He exhibits distension (severe distension tender uppper abdomen). He exhibits no mass. There is tenderness. There is no rebound and no guarding.  Severe distension and pain, scars from prior laparoscopic cholecystectomy.  Musculoskeletal: He exhibits no edema.  Lymphadenopathy:    He has no cervical adenopathy.  Neurological: He is alert and oriented to person, place, and time. No cranial nerve deficit.  Skin: No rash noted. He is diaphoretic. No erythema. There is pallor.  diaphoretic  Psychiatric:  Severe pain    Assessment/Plan: 1.  Colon perforation during colonoscopy 2.  Hx of diverticular disease 3.  Esophageal stricture and hiatal hernia 4.  Hypertension 5.  Tobacco use 6.  Etoh use.  Plan:  Emergent laparotomy for evaluation of bowel perforation. Pain meds, zosyn ordered.  Philip Castillo 04/21/2013, 3:46 PM

## 2013-04-21 NOTE — H&P (Signed)
Admission Note  Primary Care Physician:  Unice Cobble, MD Primary Gastroenterologist:   Scarlette Shorts,  MD  HPI: Philip Castillo is a 63 y.o. male who underwent screening colonoscopy today for polyp surveillance. Post procedure patient developed severe abdominal pain, distention and tachycardia.Marland Kitchen He was transported by ambulance to [facility] ED. In ED patient has heart rate in 150's, he is hypertensive. Stat Dilaudid, IVF and abdominal series ordered. Surgery called for urgent consult. Labs and antibiotics already ordered.  Past Medical History  Diagnosis Date  . Allergy     seasonal  . GERD (gastroesophageal reflux disease)     stressinduced  . Diverticulosis   . Hyperglycemia 11/2008    FBS 107  . Hiatal hernia   . Esophageal stricture   . Cataract     bil removed  . Hypertension   . Adenomatous colon polyp     Past Surgical History  Procedure Laterality Date  . Retinal detachment surgery  03/17/11 & 04/16/11     X2   . Cholecystectomy, laparoscopic  2006  . Colonoscopy w/ polypectomy  2002 ; 2004 & 2009    Dr Henrene Pastor; tubular adenoma & hyperplastic polyp  . Cataract extraction, bilateral    . Upper gastrointestinal endoscopy  2007    hiatal hernia; stricture   . Cholecystectomy      Prior to Admission medications   Medication Sig Start Date End Date Taking? Authorizing Provider  aspirin 81 MG tablet Take 81 mg by mouth daily.   Yes Historical Provider, MD  Cholecalciferol (VITAMIN D-3) 1000 UNITS CAPS Take 1 capsule by mouth daily as needed.   Yes Historical Provider, MD  esomeprazole (NEXIUM 24HR) 20 MG capsule Take 2 capsules (40 mg total) by mouth daily at 12 noon. 04/17/13  Yes Irene Shipper, MD  lisinopril (PRINIVIL,ZESTRIL) 20 MG tablet Take 1 tablet (20 mg total) by mouth daily. 04/07/13  Yes Hendricks Limes, MD  loratadine (CLARITIN) 10 MG tablet Take 10 mg by mouth daily as needed for allergies.    Yes Historical Provider, MD  MELATONIN ER PO Take 1 capsule by mouth at  bedtime.   Yes Historical Provider, MD  Multiple Vitamins-Minerals (PRESERVISION AREDS 2 PO) Take 1 capsule by mouth 2 (two) times daily.   Yes Historical Provider, MD    Current Facility-Administered Medications  Medication Dose Route Frequency Provider Last Rate Last Dose  . HYDROmorphone (DILAUDID) injection 1 mg  1 mg Intravenous Once Earnstine Regal, PA-C      . piperacillin-tazobactam (ZOSYN) IVPB 3.375 g  3.375 g Intravenous Once Ephraim Hamburger, MD       Current Outpatient Prescriptions  Medication Sig Dispense Refill  . aspirin 81 MG tablet Take 81 mg by mouth daily.      . Cholecalciferol (VITAMIN D-3) 1000 UNITS CAPS Take 1 capsule by mouth daily as needed.      Marland Kitchen esomeprazole (NEXIUM 24HR) 20 MG capsule Take 2 capsules (40 mg total) by mouth daily at 12 noon.  1 capsule  0  . lisinopril (PRINIVIL,ZESTRIL) 20 MG tablet Take 1 tablet (20 mg total) by mouth daily.  30 tablet  0  . loratadine (CLARITIN) 10 MG tablet Take 10 mg by mouth daily as needed for allergies.       Marland Kitchen MELATONIN ER PO Take 1 capsule by mouth at bedtime.      . Multiple Vitamins-Minerals (PRESERVISION AREDS 2 PO) Take 1 capsule by mouth 2 (two) times daily.  Facility-Administered Medications Ordered in Other Encounters  Medication Dose Route Frequency Provider Last Rate Last Dose  . 0.9 %  sodium chloride infusion  500 mL Intravenous Continuous Irene Shipper, MD      . fentaNYL (SUBLIMAZE) injection 50 mcg  50 mcg Intravenous Once Irene Shipper, MD        Allergies as of 04/21/2013  . (No Known Allergies)    Family History  Problem Relation Age of Onset  . Heart disease Mother     CHF  . Crohn's disease Mother   . Heart disease Father     MI @ 29  . Cancer Neg Hx   . Stroke Neg Hx   . Colon cancer Neg Hx   . Esophageal cancer Neg Hx   . Rectal cancer Neg Hx   . Stomach cancer Neg Hx   . Dementia Brother     Alsheimer's  . Crohn's disease Brother     History   Social History  .  Marital Status: Married    Spouse Name: N/A    Number of Children: N/A  . Years of Education: N/A   Occupational History  . Not on file.   Social History Main Topics  . Smoking status: Current Some Day Smoker -- 30 years    Types: Cigars  . Smokeless tobacco: Never Used     Comment: 1 cigar / day &  10 cigarettes/day; form given 04/17/13  . Alcohol Use: 7.2 oz/week    12 Cans of beer per week  . Drug Use: No  . Sexual Activity: Not on file   Review of Systems:  All systems reviewed an negative except where noted in HPI.  Physical Exam: Vital signs in last 24 hours: Temp:  [97.3 F (36.3 C)-97.5 F (36.4 C)] 97.5 F (36.4 C) (02/13 1545) Pulse Rate:  [24-150] 150 (02/13 1545) Resp:  [16-53] 32 (02/13 1545) BP: (111-160)/(66-114) 143/101 mmHg (02/13 1545) SpO2:  [88 %-99 %] 95 % (02/13 1545) Weight:  [168 lb (76.204 kg)] 168 lb (76.204 kg) (02/13 1259)   General:  Pleasant, well-developed, white male having extreme physical discomfort at this time.  head:  Normocephalic and atraumatic. Eyes:  Sclera clear, no icterus.   Conjunctiva pink. Ears:  Normal auditory acuity. Mouth:  Dry mucosa / dry lips.  Neck:  Supple; no masses . Lungs:  Clear throughout to auscultation.   No wheezes, rapid respirations (30's).  Heart:  Tachycardic at 150's Abdomen:  Firm, distended, hypoactive bowel sounds, +peritoneal signs.  Msk:  Symmetrical without gross deformities.. Extremities:  Without edema. Neurologic:  Alert and  oriented x4;  grossly normal neurologically. Skin:  clammy Cervical Nodes:  No significant cervical adenopathy. Psych:  Alert and cooperative.   Studies/Results: Dg Chest Portable 1 View  04/21/2013   CLINICAL DATA:  Distended abdomen after colonoscopy.  EXAM: PORTABLE CHEST - 1 VIEW  COMPARISON:  None.  FINDINGS: There is very prominent free air in the abdomen. There is slight atelectasis at the left lung base. Heart size and vascularity are normal. No acute osseous  abnormality.  IMPRESSION: Extensive free air in the abdomen.  Critical Value/emergent results were called by telephone at the time of interpretation on 04/21/2013 at 3:38 PM to Dr. Sherwood Gambler , who verbally acknowledged these results.   Electronically Signed   By: Rozetta Nunnery M.D.   On: 04/21/2013 15:38   Colonoscopy COLON FINDINGS: Severe diverticulosis was in the left colon with  deep folds  and marked stenosis. difficult to negotiate. The colon  mucosa was otherwise normal. Retroflexed views revealed internal  hemorrhoids. The time to cecum=10 minutes 08 seconds. Withdrawal  time=11 minutes 02 seconds. The scope was withdrawn and the  procedure completed.  COMPLICATIONS: There were no complications.  ENDOSCOPIC IMPRESSION:  1. Severe diverticulosis with stenosiswas noted in the left colon  2. The colon mucosa was otherwise normal  RECOMMENDATIONS:  1. Continue current colorectal surveillance recommendations with a  repeat colonoscopy in 10 years.   Impression / Plan:    63 year old male with post-colonoscopy bowel perforation. Abdominal series not needed as CXR shows extensive free air. Surgery evaluated patient within minutes of his arrival. They are aware of results and are making preparations for emergent surgery.  IV fluids and pain meds in progress. Zosyn already ordered. Labs pending. I did go ahead and request a bed in step down following surgery.     LOS: 0 days   Philip Castillo  04/21/2013, 3:49 PM  GI ATTENDING  As above. Case discussed with Dr Hassell Done, of general surgery.  Docia Chuck. Geri Seminole., M.D. [facility] Division of Gastroenterology

## 2013-04-21 NOTE — Transfer of Care (Signed)
Immediate Anesthesia Transfer of Care Note  Patient: Philip Castillo  Procedure(s) Performed: Procedure(s) (LRB): EXPLORATORY LAPAROTOMY /RIGHT HEMI-COLECTOMY (N/A)  Patient Location: PACU  Anesthesia Type: General  Level of Consciousness: sedated, patient cooperative and responds to stimulation  Airway & Oxygen Therapy: Patient Spontanous Breathing and Patient connected to face mask oxgen  Post-op Assessment: Report given to PACU RN and Post -op Vital signs reviewed and stable  Post vital signs: Reviewed and stable  Complications: No apparent anesthesia complications

## 2013-04-21 NOTE — Progress Notes (Signed)
Rectal tube was inserted about 2 inches into the rectum for relief; resistance was noted so the tube was not inserted any further.  No results so rectal tube was removed.  Dr aware of all of the situation including the very hard abdomen.  Patient is tachy and in a lot of pain.  911 called to get patient; all forms filled out for ED; [facility] called with report at this time. Ernestine Conrad, RN

## 2013-04-21 NOTE — Progress Notes (Signed)
A/ox3 pleased with MAC, report to Karol RN 

## 2013-04-21 NOTE — ED Notes (Signed)
Bed: KQ20 Expected date:  Expected time:  Means of arrival:  Comments: EMS-endoscopy

## 2013-04-21 NOTE — Progress Notes (Signed)
Abdomen firm and distended Dr. Henrene Pastor aware

## 2013-04-21 NOTE — Interval H&P Note (Signed)
History and Physical Interval Note:  04/21/2013 4:25 PM  Philip Castillo  has presented today for surgery, with the diagnosis of perforated colon  The various methods of treatment have been discussed with the patient and family. After consideration of risks, benefits and other options for treatment, the patient has consented to  Procedure(s): EXPLORATORY LAPAROTOMY (N/A) as a surgical intervention .  The patient's history has been reviewed, patient examined, no change in status, stable for surgery.  I have reviewed the patient's chart and labs.  Questions were answered to the patient's satisfaction.     [name]

## 2013-04-21 NOTE — Consult Note (Signed)
Reason for Consult: Possible colon perforation during colonoscopy Referring Physician: Dr. Perry  Philip Castillo is an 63 y.o. male.  HPI: 63 y/o undergoing diagnostic colonoscopy for some bleeding, apparently his H/H was also down, hx of diverticulosis, but no diverticulitis. Prep was good and he developed pain during the procedure.  He has significant diverticular disease in the sigmoid and this area was strictured .  There is also concern that because of the diverticular disease it could be clear over at the cecum.  He was transported to the ER by ambulance from the Endoscopy center.  In the ER here his abdomen is severely distended and painful.  He is tachycardic and diaphoretic with 10/10 pain.  Single view of the chest shows:  There is very prominent free air in the abdomen. There is slight  atelectasis at the left lung base. Heart size and vascularity are  normal. No acute osseous abnormality.  He is being taken directly to the OR for exploratory laparotomy.     Past Medical History  Diagnosis Date   Tobacco use since age 17    EOTH  2-4 beers per day after work    . Hypertension      Hx of  Esophageal stricture . Hiatal hernia       . Diverticulosis history     . GERD (gastroesophageal reflux disease)     stressinduced  . Hyperglycemia 11/2008    FBS 107  . Cataract     bil removed   Allergy    seasonal       Past Surgical History  Procedure Laterality Date  . Retinal detachment surgery  03/17/11 & 04/16/11     X2   . Cholecystectomy, laparoscopic  2006  . Colonoscopy w/ polypectomy  2002 ; 2004 & 2009    Dr Perry; tubular adenoma & hyperplastic polyp  . Cataract extraction, bilateral    . Upper gastrointestinal endoscopy  2007    hiatal hernia; stricture   . Cholecystectomy      Family History  Problem Relation Age of Onset  . Heart disease Mother     CHF  . Crohn's disease Mother   . Heart disease Father     MI @ 66  . Cancer Neg Hx   . Stroke Neg Hx   .  Colon cancer Neg Hx   . Esophageal cancer Neg Hx   . Rectal cancer Neg Hx   . Stomach cancer Neg Hx   . Dementia Brother     Alsheimer's  . Crohn's disease Brother     Social History:  reports that he has been smoking Cigars.  He has never used smokeless tobacco. He reports that he drinks about 7.2 ounces of alcohol per week. He reports that he does not use illicit drugs.  Allergies: No Known Allergies  Medications:  Prior to Admission:  (Not in a hospital admission) Scheduled:  Continuous: . piperacillin-tazobactam     PRN: Anti-infectives   Start     Dose/Rate Route Frequency Ordered Stop   04/21/13 1530  piperacillin-tazobactam (ZOSYN) IVPB 3.375 g     3.375 g 100 mL/hr over 30 Minutes Intravenous  Once 04/21/13 1515        No results found for this or any previous visit (from the past 48 hour(s)).  Dg Chest Portable 1 View  04/21/2013   CLINICAL DATA:  Distended abdomen after colonoscopy.  EXAM: PORTABLE CHEST - 1 VIEW  COMPARISON:  None.  FINDINGS: There   is very prominent free air in the abdomen. There is slight atelectasis at the left lung base. Heart size and vascularity are normal. No acute osseous abnormality.  IMPRESSION: Extensive free air in the abdomen.  Critical Value/emergent results were called by telephone at the time of interpretation on 04/21/2013 at 3:38 PM to Dr. Sherwood Gambler , who verbally acknowledged these results.   Electronically Signed   By: Rozetta Nunnery M.D.   On: 04/21/2013 15:38    Review of Systems  Unable to perform ROS: medical condition   SpO2 94.00%. Physical Exam  Constitutional: He is oriented to person, place, and time. He appears well-developed and well-nourished. He appears distressed.  Looks older than stated age. SpO2 94% Orient RR 47 BP 155/110 HR 150's   HENT:  Head: Normocephalic and atraumatic.  Nose: Nose normal.  Eyes: Conjunctivae are normal. Pupils are equal, round, and reactive to light. Right eye exhibits discharge.  Left eye exhibits no discharge.  Neck: Normal range of motion. Neck supple. No tracheal deviation present. No thyromegaly present.  Cardiovascular: Normal heart sounds and intact distal pulses.   No murmur heard. Looks like Sinus tachycardia on telem  EKG pending  Respiratory: No respiratory distress. He has no wheezes. He has no rales. He exhibits no tenderness.  RR 47 No distress, just allot of pain  GI: He exhibits distension (severe distension tender uppper abdomen). He exhibits no mass. There is tenderness. There is no rebound and no guarding.  Severe distension and pain, scars from prior laparoscopic cholecystectomy.  Musculoskeletal: He exhibits no edema.  Lymphadenopathy:    He has no cervical adenopathy.  Neurological: He is alert and oriented to person, place, and time. No cranial nerve deficit.  Skin: No rash noted. He is diaphoretic. No erythema. There is pallor.  diaphoretic  Psychiatric:  Severe pain    Assessment/Plan: 1.  Colon perforation during colonoscopy 2.  Hx of diverticular disease 3.  Esophageal stricture and hiatal hernia 4.  Hypertension 5.  Tobacco use 6.  Etoh use.  Plan:  Emergent laparotomy for evaluation of bowel perforation. Pain meds, zosyn ordered.  [name] 04/21/2013, 3:46 PM

## 2013-04-21 NOTE — H&P (View-Only) (Signed)
I have seen and examined this patient and I discussed sigmoid resection with him but he was not interested.  He wants me to just repair the perforation.  This may be in the cecum or in the sigmoid.   

## 2013-04-21 NOTE — Op Note (Signed)
[facility]  [address] [place], [address]   COLONOSCOPY PROCEDURE REPORT  PATIENT: Philip Castillo, Philip Castillo  MR#: [id number] BIRTHDATE: 1951/01/18 , 62  yrs. old GENDER: Male ENDOSCOPIST: Eustace Quail, MD REFERRED BY:.  Self / Office PROCEDURE DATE:  04/21/2013 PROCEDURE:   Colonoscopy, diagnostic First Screening Colonoscopy - Avg.  risk and is 50 yrs.  old or older - No.  Prior Negative Screening - Now for repeat screening. N/A  History of Adenoma - Now for follow-up colonoscopy & has been > or = to 3 yrs.  Yes hx of adenoma.  Has been 3 or more years since last colonoscopy.  Polyps Removed Today? No.  Recommend repeat exam, <10 yrs? No. ASA CLASS:   Class II INDICATIONS:rectal bleeding and Patient's personal history of adenomatous colon polyps. prior 2002,2004, 2007 (-). MEDICATIONS: MAC sedation, administered by CRNA and propofol (Diprivan) 450mg  IV  DESCRIPTION OF PROCEDURE:   After the risks benefits and alternatives of the procedure were thoroughly explained, informed consent was obtained.  A digital rectal exam revealed no abnormalities of the rectum.   The LB [id number]  endoscope was introduced through the anus and advanced to the cecum, which was identified by both the appendix and ileocecal valve. No adverse events experienced.   The quality of the prep was excellent, using MoviPrep  The instrument was then slowly withdrawn as the colon was fully examined.      COLON FINDINGS: Severe diverticulosis was in the left colon with deep folds and marked stenosis. difficult to negotiate.   The colon mucosa was otherwise normal.  Retroflexed views revealed internal hemorrhoids. The time to cecum=10 minutes 08 seconds.  Withdrawal time=11 minutes 02 seconds.  The scope was withdrawn and the procedure completed.  COMPLICATIONS: There were no complications.  ENDOSCOPIC IMPRESSION: 1.   Severe diverticulosis with stenosiswas noted in the left  colon 2.   The colon mucosa was otherwise normal  RECOMMENDATIONS: 1. Continue current colorectal surveillance recommendations with a repeat colonoscopy in 10 years.   eSigned:  Eustace Quail, MD 04/21/2013 2:06 PM   cc: Hendricks Limes, MD and The Patient

## 2013-04-21 NOTE — ED Provider Notes (Signed)
CSN: [id number]     Arrival date & time 04/21/13  1458 History   First MD Initiated Contact with Patient 04/21/13 1503     Chief Complaint  Patient presents with  . Post-op Problem  . Abdominal Pain     (Consider location/radiation/quality/duration/timing/severity/associated sxs/prior Treatment) HPI Comments: 63 year old male presents from the GI suite after having acute abdominal distention and pain after colonoscopy. This occurred approximately 20-30 minutes ago. This is visiting from the GI PA. The patient currently had diffuse diverticulosis and had acute distention and pain of his abdomen during the procedure. He was then sent over to [facility] long ER for immediate evaluation. The patient describes the pain as severe.   Past Medical History  Diagnosis Date  . Allergy     seasonal  . GERD (gastroesophageal reflux disease)     stressinduced  . Diverticulosis   . Hyperglycemia 11/2008    FBS 107  . Hiatal hernia   . Esophageal stricture   . Cataract     bil removed  . Hypertension    Past Surgical History  Procedure Laterality Date  . Retinal detachment surgery  03/17/11 & 04/16/11     X2   . Cholecystectomy, laparoscopic  2006  . Colonoscopy w/ polypectomy  2002 ; 2004 & 2009    Dr Henrene Pastor; tubular adenoma & hyperplastic polyp  . Cataract extraction, bilateral    . Upper gastrointestinal endoscopy  2007    hiatal hernia; stricture   . Cholecystectomy     Family History  Problem Relation Age of Onset  . Heart disease Mother     CHF  . Crohn's disease Mother   . Heart disease Father     MI @ 65  . Cancer Neg Hx   . Stroke Neg Hx   . Colon cancer Neg Hx   . Esophageal cancer Neg Hx   . Rectal cancer Neg Hx   . Stomach cancer Neg Hx   . Dementia Brother     Alsheimer's  . Crohn's disease Brother    History  Substance Use Topics  . Smoking status: Current Some Day Smoker -- 30 years    Types: Cigars  . Smokeless tobacco: Never Used     Comment: 1 cigar / day &  10  cigarettes/day; form given 04/17/13  . Alcohol Use: 7.2 oz/week    12 Cans of beer per week    Review of Systems  Unable to perform ROS: Acuity of condition  Gastrointestinal: Positive for abdominal pain.      Allergies  Review of patient's allergies indicates no known allergies.  Home Medications   Current Outpatient Rx  Name  Route  Sig  Dispense  Refill  . aspirin 81 MG tablet   Oral   Take 81 mg by mouth daily.         Marland Kitchen esomeprazole (NEXIUM 24HR) 20 MG capsule   Oral   Take 2 capsules (40 mg total) by mouth daily at 12 noon.   1 capsule   0   . lisinopril (PRINIVIL,ZESTRIL) 20 MG tablet   Oral   Take 1 tablet (20 mg total) by mouth daily.   30 tablet   0   . loratadine (CLARITIN) 10 MG tablet   Oral   Take 10 mg by mouth as needed.         Marland Kitchen MELATONIN ER PO   Oral   Take 1 capsule by mouth at bedtime.         Marland Kitchen  Multiple Vitamins-Minerals (PRESERVISION AREDS 2 PO)   Oral   Take 1 capsule by mouth 2 (two) times daily.          SpO2 94% Physical Exam  Nursing note and vitals reviewed. Constitutional: He is oriented to person, place, and time. He appears well-developed and well-nourished. He appears distressed.  HENT:  Head: Normocephalic and atraumatic.  Right Ear: External ear normal.  Left Ear: External ear normal.  Nose: Nose normal.  Eyes: Right eye exhibits no discharge. Left eye exhibits no discharge.  Neck: Neck supple.  Cardiovascular: Regular rhythm, normal heart sounds and intact distal pulses.  Tachycardia present.   Pulmonary/Chest: Tachypnea noted.  Abdominal: He exhibits distension. There is generalized tenderness. There is rigidity and guarding.  Very hard, diffusely tender abdomen  Musculoskeletal: He exhibits no edema.  Neurological: He is alert and oriented to person, place, and time.  Skin: Skin is warm and dry. There is pallor.    ED Course  Procedures (including critical care time) Labs Review Labs Reviewed  CBC WITH  DIFFERENTIAL  BASIC METABOLIC PANEL  HEPATIC FUNCTION PANEL   Imaging Review Dg Chest Portable 1 View  04/21/2013   CLINICAL DATA:  Distended abdomen after colonoscopy.  EXAM: PORTABLE CHEST - 1 VIEW  COMPARISON:  None.  FINDINGS: There is very prominent free air in the abdomen. There is slight atelectasis at the left lung base. Heart size and vascularity are normal. No acute osseous abnormality.  IMPRESSION: Extensive free air in the abdomen.  Critical Value/emergent results were called by telephone at the time of interpretation on 04/21/2013 at 3:38 PM to Dr. Sherwood Gambler , who verbally acknowledged these results.   Electronically Signed   By: Rozetta Nunnery M.D.   On: 04/21/2013 15:38    EKG Interpretation    Date/Time:  Friday April 21 2013 15:42:50 EST Ventricular Rate:  149 PR Interval:  115 QRS Duration: 91 QT Interval:  371 QTC Calculation: 584 R Axis:   64 Text Interpretation:  Sinus tachycardia Prolonged QT interval Artifact in lead(s) V4 V5 V6 Confirmed by Eriyanna Kofoed  MD, Sevan Mcbroom ([id number]) on 04/21/2013 3:44:48 PM           CRITICAL CARE Performed by: Sherwood Gambler T   Total critical care time: 30 minutes  Critical care time was exclusive of separately billable procedures and treating other patients.  Critical care was necessary to treat or prevent imminent or life-threatening deterioration.  Critical care was time spent personally by me on the following activities: development of treatment plan with patient and/or surrogate as well as nursing, discussions with consultants, evaluation of patient's response to treatment, examination of patient, obtaining history from patient or surrogate, ordering and performing treatments and interventions, ordering and review of laboratory studies, ordering and review of radiographic studies, pulse oximetry and re-evaluation of patient's condition.  MDM   Final diagnoses:  Pneumoperitoneum  Bowel perforation    Patient with rigid  abdomen and peritonitis. He is maintaining his blood pressure was given multiple liters of fluid while in the ED. Surgical was consult at after initial exam and x-ray confirms a large perforation. The patient was given IV Zosyn coverage abdominal organisms. He was given IV Dilaudid for pain and surgery requested EKG for preop status. His airway was maintained throughout his ED stay. He was taken to the the OR was available he was transported emergently to the OR.    Ephraim Hamburger, MD 04/21/13 [phone]

## 2013-04-21 NOTE — Consult Note (Signed)
I have seen and examined this patient and I discussed sigmoid resection with him but he was not interested.  He wants me to just repair the perforation.  This may be in the cecum or in the sigmoid.

## 2013-04-21 NOTE — Brief Op Note (Signed)
04/21/2013  6:43 PM  PATIENT:  Fay Records  63 y.o. male  PRE-OPERATIVE DIAGNOSIS:  perforated colon  POST-OPERATIVE DIAGNOSIS:  right colon perforation  PROCEDURE:  Procedure(s): EXPLORATORY LAPAROTOMY (N/A)  SURGEON:  Surgeon(s) and Role:    * [name], MD - Primary  PHYSICIAN ASSISTANT:   ASSISTANTS: none   ANESTHESIA:   general  EBL:  Total I/O In: 3000 [I.V.:3000] Out: 775 [Urine:125; Blood:650]  BLOOD ADMINISTERED:none  DRAINS: none   LOCAL MEDICATIONS USED:  NONE  SPECIMEN:  Source of Specimen:  right colon  DISPOSITION OF SPECIMEN:  PATHOLOGY  COUNTS:  YES  TOURNIQUET:  * No tourniquets in log *  DICTATION: .Other Dictation: Dictation Number  [id number]  PLAN OF CARE: Admit to inpatient   PATIENT DISPOSITION:  PACU - hemodynamically stable.   Delay start of Pharmacological VTE agent (>24hrs) due to surgical blood loss or risk of bleeding: yes

## 2013-04-21 NOTE — ED Notes (Addendum)
Pt had routine colonoscopy today only finding diverticulosis. Woke up after procedure with intense pain, very distended abdomen. Pt is not yet passing flatus but staff at [facility] is concerned that there may have been a perforation of the bowel. Pt given 6mcg of fentanyl before coming over by [facility] staff. 22g LAC present from [facility]. Tachycardic.

## 2013-04-21 NOTE — Preoperative (Signed)
Beta Blockers   Reason not to administer Beta Blockers:Not Applicable 

## 2013-04-21 NOTE — Progress Notes (Signed)
Esmolol 30mg  given by dr rose

## 2013-04-21 NOTE — ED Notes (Signed)
Surgery is ready for pt stat. OR will complete OR checklist.

## 2013-04-21 NOTE — Anesthesia Postprocedure Evaluation (Signed)
  Anesthesia Post-op Note  Patient: Philip Castillo  Procedure(s) Performed: Procedure(s) (LRB): EXPLORATORY LAPAROTOMY /RIGHT HEMI-COLECTOMY (N/A)  Patient Location: PACU  Anesthesia Type: General  Level of Consciousness: awake and alert   Airway and Oxygen Therapy: Patient Spontanous Breathing  Post-op Pain: mild  Post-op Assessment: Post-op Vital signs reviewed, Patient's Cardiovascular Status Stable, Respiratory Function Stable, Patent Airway and No signs of Nausea or vomiting  Last Vitals:  Filed Vitals:   04/21/13 1945  BP:   Pulse:   Temp: 36.8 C  Resp:     Post-op Vital Signs: stable   Complications: No apparent anesthesia complications

## 2013-04-22 DIAGNOSIS — I10 Essential (primary) hypertension: Secondary | ICD-10-CM | POA: Diagnosis present

## 2013-04-22 LAB — CBC
HEMATOCRIT: 31 % — AB (ref 39.0–52.0)
Hemoglobin: 11.3 g/dL — ABNORMAL LOW (ref 13.0–17.0)
MCH: 34.7 pg — ABNORMAL HIGH (ref 26.0–34.0)
MCHC: 36.5 g/dL — ABNORMAL HIGH (ref 30.0–36.0)
MCV: 95.1 fL (ref 78.0–100.0)
Platelets: 298 10*3/uL (ref 150–400)
RBC: 3.26 MIL/uL — ABNORMAL LOW (ref 4.22–5.81)
RDW: 12.3 % (ref 11.5–15.5)
WBC: 17.2 10*3/uL — AB (ref 4.0–10.5)

## 2013-04-22 LAB — BASIC METABOLIC PANEL
BUN: 8 mg/dL (ref 6–23)
CHLORIDE: 97 meq/L (ref 96–112)
CO2: 22 meq/L (ref 19–32)
Calcium: 8.3 mg/dL — ABNORMAL LOW (ref 8.4–10.5)
Creatinine, Ser: 0.84 mg/dL (ref 0.50–1.35)
GFR calc Af Amer: >90 mL/min (ref >90)
GFR calc non Af Amer: >90 mL/min (ref >90)
Glucose, Bld: 142 mg/dL — ABNORMAL HIGH (ref 70–99)
Potassium: 3.9 mEq/L (ref 3.7–5.3)
Sodium: 131 mEq/L — ABNORMAL LOW (ref 137–147)

## 2013-04-22 LAB — ABO/RH: ABO/RH(D): A NEG

## 2013-04-22 MED ORDER — ACETAMINOPHEN 10 MG/ML IV SOLN
1000.0000 mg | Freq: Four times a day (QID) | INTRAVENOUS | Status: AC | PRN
  Filled 2013-04-22: qty 100

## 2013-04-22 MED ORDER — HYDROMORPHONE HCL PF 1 MG/ML IJ SOLN
0.5000 mg | INTRAMUSCULAR | Status: DC | PRN
  Administered 2013-04-22: 1 mg via INTRAVENOUS
  Administered 2013-04-22: 0.5 mg via INTRAVENOUS
  Administered 2013-04-22: 2 mg via INTRAVENOUS
  Administered 2013-04-22 – 2013-04-23 (×7): 1 mg via INTRAVENOUS
  Administered 2013-04-23 – 2013-04-24 (×5): 2 mg via INTRAVENOUS
  Filled 2013-04-22: qty 1
  Filled 2013-04-22 (×3): qty 2
  Filled 2013-04-22: qty 1
  Filled 2013-04-22: qty 2
  Filled 2013-04-22: qty 1
  Filled 2013-04-22 (×2): qty 2
  Filled 2013-04-22 (×2): qty 1
  Filled 2013-04-22 (×2): qty 2
  Filled 2013-04-22: qty 1
  Filled 2013-04-22: qty 2

## 2013-04-22 MED ORDER — LACTATED RINGERS IV SOLN
INTRAVENOUS | Status: DC
  Administered 2013-04-22: 07:00:00 via INTRAVENOUS

## 2013-04-22 MED ORDER — KCL IN DEXTROSE-NACL 20-5-0.45 MEQ/L-%-% IV SOLN
INTRAVENOUS | Status: DC
  Administered 2013-04-22 (×2): via INTRAVENOUS
  Administered 2013-04-23: 20 mL/h via INTRAVENOUS
  Administered 2013-04-25: 20 mL via INTRAVENOUS
  Filled 2013-04-22 (×6): qty 1000

## 2013-04-22 NOTE — Op Note (Signed)
NAMEJYLES, SONTAG NO.:  [id number]  MEDICAL RECORD NO.:  [id number]  LOCATION:  7408                         FACILITY:  [facility]  PHYSICIAN:  Isabel Caprice. Hassell Done, MD  DATE OF BIRTH:  November 10, 1950  DATE OF PROCEDURE:  04/21/2013 DATE OF DISCHARGE:                              OPERATIVE REPORT   PREOPERATIVE DIAGNOSIS:  Acute abdomen secondary to colonic perforation during colonoscopy.  POSTOPERATIVE DIAGNOSIS:  Hemoperitoneum (800 mL), perforation of right colon with serosal laceration up to the hepatic flexure, with left-sided diverticular disease chronic.  PROCEDURE:  Laparotomy with right hemicolectomy and primary anastomosis.  DESCRIPTION OF PROCEDURE:  The patient was seen by me in the holding area, and I explained to him that we might consider dealing with his diverticular disease at the same setting as repairing or dealing with his perforation.  He did not want me to address his left colon or sigmoid colon diverticular issues and wanted me to repair of the hole and deal with that problem at this setting.  He was then taken back to room 1 and given general anesthesia.  Preoperatively, he received Unasyn.  He was prepped with PCO max widely and draped sterilely after clipping and after a Foley was placed.  A time-out was performed.  A midline laparotomy incision was made and carried down to the obvious pneumoperitoneum without difficulty.  The abdomen was entered and encountered a large amount of blood and clots.  These were evacuated with sponges and with suction and is estimated about 750 mL was removed. I found the source of bleeding in the right colon where over in a distance of about 6 inches, the right colon was slipped through the serosa and a 2 cm blow out of the mucosa with bleeding was noted.  This was managed by dividing the terminal ileum with a GIA and going through the mesentery, staying close to the bowel with the ligature.  I carried this  up to the hepatic flexure and fired the stapler again and then as I mobilized the hepatic flexure I realized that there was another area posteriorly that looked like the serosa had splayed quite a bit and I elected to resect more that to the transverse colon.  There the tissue looked healthy and pink, and I was then able to perform a side-to-side anastomosis laying the terminal ileum, any mesenteric border along the tenia and firing a 7.5 cm GIA.  The common channel was inspected and no bleeding was noted.  The common defect was closed in 2 layers with 4-0 PDS in a running locking canal fashion and outside layer of 3-0 silks.  The mesentery was closed with interrupted 3-0 silks.  We then irrigated with 4 L of saline.  We then changed our gowns and gloves and re-prepped the area.  Sponge and needle counts reported as correct, and the abdomen was closed with interrupted #1 Novafils.  The wound was irrigated and closed with staples.  The patient was taken to recovery room in satisfactory condition.     Isabel Caprice Hassell Done, MD     MBM/MEDQ  D:  04/21/2013  T:  04/22/2013  Job:  [id number]  cc:   Docia Chuck. Henrene Pastor, MD

## 2013-04-22 NOTE — Progress Notes (Signed)
GI ATTENDING  Operative findings reviewed. Appreciated Dr. Earlie Server prompt and excellent care.  Patient doing well post right hemicolectomy for barotrauma related right colon perforation. Comfortable in bed.  Wife in room. Multiple concerns and questions from wife answered to the best of my ability.  Philip Castillo. Philip Castillo., M.D. [facility] Division of Gastroenterology

## 2013-04-22 NOTE — Progress Notes (Signed)
1 Day Post-Op R hemicolectomy Subjective: Feels much better, no nausea, sore but pain controlled  Objective: Vital signs in last 24 hours: Temp:  [97.3 F (36.3 C)-98.3 F (36.8 C)] 98.1 F (36.7 C) (02/14 0505) Pulse Rate:  [24-150] 82 (02/14 0505) Resp:  [12-53] 14 (02/14 0505) BP: (111-160)/(64-114) 139/84 mmHg (02/14 0505) SpO2:  [88 %-100 %] 98 % (02/14 0505) FiO2 (%):  [3 %] 3 % (02/13 2035) Weight:  [168 lb (76.204 kg)-171 lb 6.4 oz (77.747 kg)] 171 lb 6.4 oz (77.747 kg) (02/13 2353)   Intake/Output from previous day: 02/13 0701 - 02/14 0700 In: 4005 [I.V.:3905; IV Piggyback:100] Out: 5638 [Urine:1025; Blood:650] Intake/Output this shift:     General appearance: alert and cooperative GI: normal findings: soft, non-tender  Incision: no significant drainage  Lab Results:   Recent Labs  04/21/13 1540 04/22/13 0524  WBC 11.7* 17.2*  HGB 13.8 11.3*  HCT 38.9* 31.0*  PLT 375 298   BMET  Recent Labs  04/21/13 1540 04/22/13 0524  NA 131* 131*  K 4.1 3.9  CL 98 97  CO2 17* 22  GLUCOSE 189* 142*  BUN 8 8  CREATININE 0.68 0.84  CALCIUM 8.3* 8.3*   PT/INR  Recent Labs  04/21/13 2225  LABPROT 14.2  INR 1.12   ABG No results found for this basename: PHART, PCO2, PO2, HCO3,  in the last 72 hours  MEDS, Scheduled . heparin subcutaneous  5,000 Units Subcutaneous 3 times per day  . piperacillin-tazobactam (ZOSYN)  IV  3.375 g Intravenous Q8H    Studies/Results: Dg Chest Portable 1 View  04/21/2013   CLINICAL DATA:  Distended abdomen after colonoscopy.  EXAM: PORTABLE CHEST - 1 VIEW  COMPARISON:  None.  FINDINGS: There is very prominent free air in the abdomen. There is slight atelectasis at the left lung base. Heart size and vascularity are normal. No acute osseous abnormality.  IMPRESSION: Extensive free air in the abdomen.  Critical Value/emergent results were called by telephone at the time of interpretation on 04/21/2013 at 3:38 PM to Dr. Sherwood Gambler , who verbally acknowledged these results.   Electronically Signed   By: Rozetta Nunnery M.D.   On: 04/21/2013 15:38    Assessment: s/p Procedure(s): EXPLORATORY LAPAROTOMY /RIGHT HEMI-COLECTOMY Patient Active Problem List   Diagnosis Date Noted  . Hypertension   . Colon perforation s/p proximal colectomy 04/21/2013 04/21/2013  . DIVERTICULOSIS, COLON 11/23/2008  . 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 GERD 07/04/2007  . COLONIC POLYPS, HX OF 07/04/2007    Doing well after surgery  Plan: d/c foley Advance diet to clears as tolerated Cont IV fluids Ambulate   LOS: 1 day     .Rosario Adie, [facility], [place]   04/22/2013 8:45 AM

## 2013-04-23 LAB — CBC
HCT: 28.6 % — ABNORMAL LOW (ref 39.0–52.0)
Hemoglobin: 10.3 g/dL — ABNORMAL LOW (ref 13.0–17.0)
MCH: 34.2 pg — AB (ref 26.0–34.0)
MCHC: 36 g/dL (ref 30.0–36.0)
MCV: 95 fL (ref 78.0–100.0)
PLATELETS: 271 10*3/uL (ref 150–400)
RBC: 3.01 MIL/uL — ABNORMAL LOW (ref 4.22–5.81)
RDW: 12.1 % (ref 11.5–15.5)
WBC: 11.6 10*3/uL — ABNORMAL HIGH (ref 4.0–10.5)

## 2013-04-23 LAB — BASIC METABOLIC PANEL
BUN: 5 mg/dL — ABNORMAL LOW (ref 6–23)
CALCIUM: 9 mg/dL (ref 8.4–10.5)
CO2: 27 mEq/L (ref 19–32)
CREATININE: 0.83 mg/dL (ref 0.50–1.35)
Chloride: 96 mEq/L (ref 96–112)
Glucose, Bld: 123 mg/dL — ABNORMAL HIGH (ref 70–99)
Potassium: 4.1 mEq/L (ref 3.7–5.3)
Sodium: 132 mEq/L — ABNORMAL LOW (ref 137–147)

## 2013-04-23 MED ORDER — DIPHENHYDRAMINE HCL 25 MG PO CAPS
25.0000 mg | ORAL_CAPSULE | Freq: Every evening | ORAL | Status: DC | PRN
  Administered 2013-04-23: 25 mg via ORAL
  Filled 2013-04-23: qty 1

## 2013-04-23 NOTE — Progress Notes (Signed)
2 Days Post-Op R hemicolectomy Subjective: Feels much better, no nausea, sore but pain controlled.  Tolerated clears. No flatus or BM.  Ambulating  Objective: Vital signs in last 24 hours: Temp:  [97.4 F (36.3 C)-98.7 F (37.1 C)] 97.4 F (36.3 C) (02/15 0520) Pulse Rate:  [91-98] 93 (02/15 0520) Resp:  [18] 18 (02/15 0520) BP: (131-149)/(78-88) 149/88 mmHg (02/15 0520) SpO2:  [97 %-98 %] 98 % (02/15 0520)   Intake/Output from previous day: 02/14 0701 - 02/15 0700 In: 2511.3 [P.O.:840; I.V.:1571.3; IV Piggyback:100] Out: 1175 [Urine:1175] Intake/Output this shift: Total I/O In: 360 [P.O.:360] Out: 775 [Urine:775]   General appearance: alert and cooperative GI: normal findings: soft, non-tender  Incision: old bloody drainage noted, wound underneath looks fine  Lab Results:   Recent Labs  04/22/13 0524 04/23/13 0552  WBC 17.2* 11.6*  HGB 11.3* 10.3*  HCT 31.0* 28.6*  PLT 298 271   BMET  Recent Labs  04/22/13 0524 04/23/13 0552  NA 131* 132*  K 3.9 4.1  CL 97 96  CO2 22 27  GLUCOSE 142* 123*  BUN 8 5*  CREATININE 0.84 0.83  CALCIUM 8.3* 9.0   PT/INR  Recent Labs  04/21/13 2225  LABPROT 14.2  INR 1.12   ABG No results found for this basename: PHART, PCO2, PO2, HCO3,  in the last 72 hours  MEDS, Scheduled . heparin subcutaneous  5,000 Units Subcutaneous 3 times per day  . piperacillin-tazobactam (ZOSYN)  IV  3.375 g Intravenous Q8H    Studies/Results: Dg Chest Portable 1 View  04/21/2013   CLINICAL DATA:  Distended abdomen after colonoscopy.  EXAM: PORTABLE CHEST - 1 VIEW  COMPARISON:  None.  FINDINGS: There is very prominent free air in the abdomen. There is slight atelectasis at the left lung base. Heart size and vascularity are normal. No acute osseous abnormality.  IMPRESSION: Extensive free air in the abdomen.  Critical Value/emergent results were called by telephone at the time of interpretation on 04/21/2013 at 3:38 PM to Dr. Sherwood Gambler  , who verbally acknowledged these results.   Electronically Signed   By: Rozetta Nunnery M.D.   On: 04/21/2013 15:38    Assessment: s/p Procedure(s): EXPLORATORY LAPAROTOMY /RIGHT HEMI-COLECTOMY Patient Active Problem List   Diagnosis Date Noted  . Hypertension   . Colon perforation s/p proximal colectomy 04/21/2013 04/21/2013  . DIVERTICULOSIS, COLON 11/23/2008  . 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 GERD 07/04/2007  . COLONIC POLYPS, HX OF 07/04/2007    Doing well after surgery  Plan: Advance diet to full liquids as tolerated and await return of bowel function Minimize IV fluids Ambulate   LOS: 2 days     .Rosario Adie, [facility], [place] [phone]   04/23/2013 7:39 AM

## 2013-04-24 ENCOUNTER — Encounter (HOSPITAL_COMMUNITY): Payer: Self-pay | Admitting: Surgery

## 2013-04-24 ENCOUNTER — Telehealth: Payer: Self-pay | Admitting: *Deleted

## 2013-04-24 NOTE — Telephone Encounter (Signed)
  Follow up Call-  Call back number 04/21/2013  Post procedure Call Back phone  # [phone] cell  Permission to leave phone message Yes     Patient questions:  Message left to please call us with an update.

## 2013-04-24 NOTE — Progress Notes (Signed)
3 Days Post-Op  Subjective: Taking a few liquids, but not much so far.  He had a good bowel prep for his colonoscopy so he was pretty empty.  No flatus so far.  Objective: Vital signs in last 24 hours: Temp:  [97.4 F (36.3 C)-98.2 F (36.8 C)] 98.2 F (36.8 C) (02/16 1014) Pulse Rate:  [18-96] 90 (02/16 1014) Resp:  [16] 16 (02/16 1014) BP: (146-160)/(88-102) 158/92 mmHg (02/16 1014) SpO2:  [93 %-98 %] 98 % (02/16 1014) Last BM Date: 04/21/13 720 PO recorded. Full liquid diet started yesterday Afebrile, BP up some Labs improving Intake/Output from previous day: 02/15 0701 - 02/16 0700 In: 1290 [P.O.:720; I.V.:420; IV Piggyback:150] Out: 1425 [Urine:1425] Intake/Output this shift: Total I/O In: 300 [P.O.:300] Out: 200 [Urine:200]  General appearance: alert, cooperative and no distress Resp: clear to auscultation bilaterally GI: soft, still very tender, few BS, hypoactive, there was some bleeding from the abdominal wound,.  I have cleaned the wound and changed the dressing.    Lab Results:   Recent Labs  04/22/13 0524 04/23/13 0552  WBC 17.2* 11.6*  HGB 11.3* 10.3*  HCT 31.0* 28.6*  PLT 298 271    BMET  Recent Labs  04/22/13 0524 04/23/13 0552  NA 131* 132*  K 3.9 4.1  CL 97 96  CO2 22 27  GLUCOSE 142* 123*  BUN 8 5*  CREATININE 0.84 0.83  CALCIUM 8.3* 9.0   PT/INR  Recent Labs  04/21/13 2225  LABPROT 14.2  INR 1.12     Recent Labs Lab 04/21/13 1540  AST 25  ALT 19  ALKPHOS 47  BILITOT 0.3  PROT 6.6  ALBUMIN 3.9     Lipase  No results found for this basename: lipase     Studies/Results: No results found.  Medications: . heparin subcutaneous  5,000 Units Subcutaneous 3 times per day  . piperacillin-tazobactam (ZOSYN)  IV  3.375 g Intravenous Q8H    Assessment/Plan 1.  Hemoperitoneum (800 mL), perforation of right  colon with serosal laceration up to the hepatic flexure, with left-sided diverticular disease, chronic.  04/22/2013, Philip Earls, MD.  Full liquids  Zosyn - 2/13 >>>  2. Hx of diverticular disease  3. Esophageal stricture and hiatal hernia  4. Hypertension  5. Tobacco use  6. Etoh use. 7.  DVT proph - SQ Heparin  Plan:  Continue full liquids till he has flatus and some stool coming from the rectum.     LOS: 3 days   Philip Castillo 04/24/2013  Agree with above. Looks good considering presentation.  Philip Overall, MD, [facility] Pager: [phone] Office phone:  [phone]

## 2013-04-25 MED ORDER — ESOMEPRAZOLE MAGNESIUM 40 MG PO CPDR
40.0000 mg | DELAYED_RELEASE_CAPSULE | Freq: Every day | ORAL | Status: DC
  Administered 2013-04-25 – 2013-04-27 (×3): 40 mg via ORAL
  Filled 2013-04-25 (×3): qty 1

## 2013-04-25 MED ORDER — NON FORMULARY: 40.0000 mg | Freq: Every day | Status: DC

## 2013-04-25 MED ORDER — ALUM & MAG HYDROXIDE-SIMETH 200-200-20 MG/5ML PO SUSP
30.0000 mL | ORAL | Status: DC | PRN
Start: 2013-04-25 — End: 2013-04-27
  Administered 2013-04-25 – 2013-04-26 (×2): 30 mL via ORAL
  Filled 2013-04-25 (×3): qty 30

## 2013-04-25 MED ORDER — TRAMADOL HCL 50 MG PO TABS
50.0000 mg | ORAL_TABLET | Freq: Four times a day (QID) | ORAL | Status: DC | PRN
  Administered 2013-04-25 – 2013-04-26 (×2): 50 mg via ORAL
  Filled 2013-04-25 (×2): qty 1

## 2013-04-25 NOTE — Progress Notes (Signed)
General Surgery Note  LOS: 4 days  POD -  4 Days Post-Op PCP - Wm. Hopper  Assessment/Plan: 1.  EXPLORATORY LAPAROTOMY /RIGHT HEMI-COLECTOMY - 04/21/2013 - [name], MD.  Hemoperitoneum (800 mL), perforation of right colon with serosal laceration up to the hepatic flexure, with left-sided diverticular disease  Rikki Spearing - GI  Ileus, on full liquids - await bowel funciton  Will give Ultram for pain (he's concerned about narcotics worsening ileus)  2. Hx of severe diverticular disease left colon 3. Esophageal stricture and hiatal hernia  4. Hypertension  5. Tobacco use  6. Etoh use.  7. DVT proph - SQ Heparin   Principal Problem:   Colon perforation s/p proximal colectomy 04/21/2013 Active Problems:   GERD   DIVERTICULOSIS, COLON   Hypertension   Subjective:  Still no BM - on full liquids.  Feels good otherwise. Objective:   Filed Vitals:   04/25/13 0519  BP: 159/97  Pulse: 95  Temp: 98.2 F (36.8 C)  Resp: 18     Intake/Output from previous day:  02/16 0701 - 02/17 0700 In: 1666.7 [P.O.:1100; I.V.:467.7; IV Piggyback:99] Out: 200 [Urine:200]  Intake/Output this shift:      Physical Exam:   General: WN WM who is alert and oriented.    HEENT: Normal. Pupils equal. .   Lungs: clear   Abdomen: Soft   Wound: Clean   Lab Results:    Recent Labs  04/23/13 0552  WBC 11.6*  HGB 10.3*  HCT 28.6*  PLT 271    BMET   Recent Labs  04/23/13 0552  NA 132*  K 4.1  CL 96  CO2 27  GLUCOSE 123*  BUN 5*  CREATININE 0.83  CALCIUM 9.0    PT/INR  No results found for this basename: LABPROT, INR,  in the last 72 hours  ABG  No results found for this basename: PHART, PCO2, PO2, HCO3,  in the last 72 hours   Studies/Results:  No results found.   Anti-infectives:   Anti-infectives   Start     Dose/Rate Route Frequency Ordered Stop   04/21/13 2200  piperacillin-tazobactam (ZOSYN) IVPB 3.375 g     3.375 g 12.5 mL/hr over 240 Minutes Intravenous Every  8 hours 04/21/13 2054     04/21/13 1530  [MAR Hold]  piperacillin-tazobactam (ZOSYN) IVPB 3.375 g     (On MAR Hold since 04/21/13 1635)   3.375 g 100 mL/hr over 30 Minutes Intravenous  Once 04/21/13 1515 04/21/13 1636      Alphonsa Overall, MD, FACS Pager: [facility] Office: [phone] 04/25/2013

## 2013-04-25 NOTE — Progress Notes (Signed)
CARE MANAGEMENT NOTE 04/25/2013  Patient:  DAMONTAE, LOPPNOW   Account Number:  [id number]  Date Initiated:  04/25/2013  Documentation initiated by:  Dessa Phi  Subjective/Objective Assessment:   63 Y/O M ADMITTED W/ABD PAIN.     Action/Plan:   FROM HOME.HAS PCP,PHARMACY.   Anticipated DC Date:  04/28/2013   Anticipated DC Plan:  [place]  CM consult      Choice offered to / List presented to:             Status of service:  In process, will continue to follow Medicare Important Message given?   (If response is "NO", the following Medicare IM given date fields will be blank) Date Medicare IM given:   Date Additional Medicare IM given:    Discharge Disposition:    Per UR Regulation:  Reviewed for med. necessity/level of care/duration of stay  If discussed at [place] of Stay Meetings, dates discussed:    Comments:  04/25/13 Cortina Vultaggio RN,BSN NCM [phone] POD#4 EXP LAP,R [place].NO ANTICIPATED D/C NEEDS. CARE MANAGEMENT NOTE 04/25/2013  Patient:  YAFET, CLINE   Account Number:  [id number]  Date Initiated:  04/25/2013  Documentation initiated by:  Dessa Phi  Subjective/Objective Assessment:   63 Y/O M ADMITTED W/ABD PAIN.     Action/Plan:   FROM HOME.HAS PCP,PHARMACY.   Anticipated DC Date:  04/28/2013   Anticipated DC Plan:  [place]  CM consult      Choice offered to / List presented to:             Status of service:  In process, will continue to follow Medicare Important Message given?   (If response is "NO", the following Medicare IM given date fields will be blank) Date Medicare IM given:   Date Additional Medicare IM given:    Discharge Disposition:    Per UR Regulation:  Reviewed for med. necessity/level of care/duration of stay  If discussed at [place] of Stay Meetings, dates discussed:    Comments:  04/25/13 Kveon Casanas RN,BSN NCM  [phone] POD#4 EXP LAP,R [place].NO ANTICIPATED D/C NEEDS.

## 2013-04-26 MED ORDER — ENSURE COMPLETE PO LIQD
237.0000 mL | Freq: Three times a day (TID) | ORAL | Status: DC
  Administered 2013-04-26: 237 mL via ORAL

## 2013-04-26 MED ORDER — BOOST / RESOURCE BREEZE PO LIQD
1.0000 | Freq: Three times a day (TID) | ORAL | Status: DC
  Administered 2013-04-26: 1 via ORAL

## 2013-04-26 NOTE — Progress Notes (Signed)
5 Days Post-Op  Subjective: He felt great yesterday without distension, but today he c/o some intermittent mild distension after starting full liquid diet.  He says chocolate made his GERD worse.  He was started on nexium and Maalox which have helped with his GERD.  Had some good flatus, but no BM yet.  Doesn't want to eat any more dairy as he thinks its making him gassy.  Ambulating well through the halls.  Did 5 laps yesterday.    Objective: Vital signs in last 24 hours: Temp:  [98.1 F (36.7 C)-98.8 F (37.1 C)] 98.1 F (36.7 C) (02/18 0354) Pulse Rate:  [97-116] 116 (02/18 0354) Resp:  [17-18] 18 (02/18 0354) BP: (142-156)/(91-94) 142/94 mmHg (02/18 0354) SpO2:  [92 %-96 %] 96 % (02/18 0354) Last BM Date: 04/20/13  Intake/Output from previous day: 02/17 0701 - 02/18 0700 In: 1073 [P.O.:450; I.V.:473; IV Piggyback:150] Out: -  Intake/Output this shift:    PE: Gen:  Alert, NAD, pleasant Abd: Soft, minimal tenderness, mild distension, +BS, no HSM, incisions C/D/I with staples in place, mild ecchymosis   Lab Results:  No results found for this basename: WBC, HGB, HCT, PLT,  in the last 72 hours BMET No results found for this basename: NA, K, CL, CO2, GLUCOSE, BUN, CREATININE, CALCIUM,  in the last 72 hours PT/INR No results found for this basename: LABPROT, INR,  in the last 72 hours CMP     Component Value Date/Time   NA 132* 04/23/2013 0552   K 4.1 04/23/2013 0552   CL 96 04/23/2013 0552   CO2 27 04/23/2013 0552   GLUCOSE 123* 04/23/2013 0552   BUN 5* 04/23/2013 0552   CREATININE 0.83 04/23/2013 0552   CALCIUM 9.0 04/23/2013 0552   PROT 6.6 04/21/2013 1540   ALBUMIN 3.9 04/21/2013 1540   AST 25 04/21/2013 1540   ALT 19 04/21/2013 1540   ALKPHOS 47 04/21/2013 1540   BILITOT 0.3 04/21/2013 1540   GFRNONAA >90 04/23/2013 0552   GFRAA >90 04/23/2013 0552   Lipase  No results found for this basename: lipase       Studies/Results: No results  found.  Anti-infectives: Anti-infectives   Start     Dose/Rate Route Frequency Ordered Stop   04/21/13 2200  piperacillin-tazobactam (ZOSYN) IVPB 3.375 g     3.375 g 12.5 mL/hr over 240 Minutes Intravenous Every 8 hours 04/21/13 2054     04/21/13 1530  [MAR Hold]  piperacillin-tazobactam (ZOSYN) IVPB 3.375 g     (On MAR Hold since 04/21/13 1635)   3.375 g 100 mL/hr over 30 Minutes Intravenous  Once 04/21/13 1515 04/21/13 1636       Assessment/Plan 1. POD #5 s/p EXPLORATORY LAPAROTOMY /RIGHT HEMI-COLECTOMY - 04/21/2013 - [name], MD.  Hemoperitoneum (800 mL), perforation of right colon with serosal laceration up to the hepatic flexure, with left-sided diverticular disease   Rikki Spearing - GI   Ileus, on full liquids - some flatus, no BM yet  Will give Ultram for pain (he's concerned about narcotics worsening ileus)   Zosyn day #6  2. Hx of severe diverticular disease left colon  3. Esophageal stricture and hiatal hernia  4. Hypertension  5. Tobacco use  6. Etoh use.  7. DVT proph - SQ Heparin and SCD's   LOS: 5 days    DORT, MEGAN 04/26/2013, 10:59 AM Pager: [phone]  Agree with above.  Alphonsa Overall, MD, [facility] Surgery Pager: [phone] Office phone:  [phone]

## 2013-04-27 LAB — COMPREHENSIVE METABOLIC PANEL
ALBUMIN: 3.1 g/dL — AB (ref 3.5–5.2)
ALT: 29 U/L (ref 0–53)
AST: 22 U/L (ref 0–37)
Alkaline Phosphatase: 61 U/L (ref 39–117)
BILIRUBIN TOTAL: 0.4 mg/dL (ref 0.3–1.2)
BUN: 8 mg/dL (ref 6–23)
CO2: 25 meq/L (ref 19–32)
CREATININE: 0.91 mg/dL (ref 0.50–1.35)
Calcium: 9.1 mg/dL (ref 8.4–10.5)
Chloride: 93 mEq/L — ABNORMAL LOW (ref 96–112)
GFR calc Af Amer: >90 mL/min (ref >90)
GFR calc non Af Amer: 90 mL/min — ABNORMAL LOW (ref >90)
Glucose, Bld: 102 mg/dL — ABNORMAL HIGH (ref 70–99)
POTASSIUM: 4.1 meq/L (ref 3.7–5.3)
SODIUM: 131 meq/L — AB (ref 137–147)
Total Protein: 6.5 g/dL (ref 6.0–8.3)

## 2013-04-27 LAB — CBC
HCT: 29.2 % — ABNORMAL LOW (ref 39.0–52.0)
Hemoglobin: 10.5 g/dL — ABNORMAL LOW (ref 13.0–17.0)
MCH: 33.5 pg (ref 26.0–34.0)
MCHC: 36 g/dL (ref 30.0–36.0)
MCV: 93.3 fL (ref 78.0–100.0)
PLATELETS: 373 10*3/uL (ref 150–400)
RBC: 3.13 MIL/uL — ABNORMAL LOW (ref 4.22–5.81)
RDW: 12.1 % (ref 11.5–15.5)
WBC: 8.4 10*3/uL (ref 4.0–10.5)

## 2013-04-27 MED ORDER — IBUPROFEN 200 MG PO TABS: ORAL_TABLET | ORAL | Status: AC

## 2013-04-27 MED ORDER — TRAMADOL HCL 50 MG PO TABS: 50.0000 mg | ORAL_TABLET | Freq: Four times a day (QID) | ORAL | Status: DC | PRN

## 2013-04-27 MED ORDER — ACETAMINOPHEN 325 MG PO TABS: 650.0000 mg | ORAL_TABLET | Freq: Four times a day (QID) | ORAL | Status: DC | PRN

## 2013-04-27 MED ORDER — OXYCODONE-ACETAMINOPHEN 5-325 MG PO TABS: 1.0000 | ORAL_TABLET | ORAL | Status: DC | PRN

## 2013-04-27 MED ORDER — IBUPROFEN 600 MG PO TABS
600.0000 mg | ORAL_TABLET | Freq: Four times a day (QID) | ORAL | Status: DC | PRN
  Filled 2013-04-27: qty 1

## 2013-04-27 NOTE — Care Management Note (Signed)
    Page 1 of 1   04/27/2013     4:41:13 PM   CARE MANAGEMENT NOTE 04/27/2013  Patient:  Philip Castillo, Philip Castillo   Account Number:  [id number]  Date Initiated:  04/25/2013  Documentation initiated by:  Dessa Phi  Subjective/Objective Assessment:   63 Y/O M ADMITTED W/ABD PAIN.     Action/Plan:   FROM HOME.HAS PCP,PHARMACY.   Anticipated DC Date:  04/27/2013   Anticipated DC Plan:  [place]  CM consult      Choice offered to / List presented to:             Status of service:  Completed, signed off Medicare Important Message given?   (If response is "NO", the following Medicare IM given date fields will be blank) Date Medicare IM given:   Date Additional Medicare IM given:    Discharge Disposition:  HOME/SELF CARE  Per UR Regulation:  Reviewed for med. necessity/level of care/duration of stay  If discussed at [place] of Stay Meetings, dates discussed:   04/27/2013    Comments:  04/27/13 Xuan Mateus RN,BSN NCM [phone] D/C HOME NO NEEDS OR ORDERS.  04/25/13 Latisia Hilaire RN,BSN NCM [phone] POD#4 EXP LAP,R HEMI COLECTOMY,ILEUS,NO BM,FULL LIQ.NO ANTICIPATED D/C NEEDS.

## 2013-04-27 NOTE — Discharge Summary (Signed)
Physician Discharge Summary  Patient ID: Philip Castillo MRN: [id number] DOB/AGE: 04-20-1950 63 y.o. PCP:  Unice Cobble, MD  Admit date: 04/21/2013 Discharge date: 04/27/2013  Admission Diagnoses: Acute abdomen secondary to colonic perforation  during colonoscopy.   Discharge Diagnoses: Hemoperitoneum (800 mL), perforation of right  colon with serosal laceration up to the hepatic flexure, with left-sided  diverticular disease chronic.   Principal Problem:   Colon perforation s/p proximal colectomy 04/21/2013 Active Problems:   GERD   DIVERTICULOSIS, COLON   Hypertension   PROCEDURES: Laparotomy with right hemicolectomy and primary anastomosis. 04/22/13 Dr. Johnathan Hausen.  Hospital Course: 63 y/o undergoing diagnostic colonoscopy for some bleeding, apparently his H/H was also down, hx of diverticulosis, but no diverticulitis. Prep was good and he developed pain during the procedure. He has significant diverticular disease in the sigmoid and this area was strictured . There is also concern that because of the diverticular disease it could be clear over at the cecum. He was transported to the ER by ambulance from the Endoscopy center. In the ER here his abdomen is severely distended and painful. He is tachycardic and diaphoretic with 10/10 pain. Single view of the chest shows: There is very prominent free air in the abdomen. There is slight  atelectasis at the left lung base. Heart size and vascularity are normal. No acute osseous abnormality. He is being taken directly to the OR for exploratory laparotomy.   Exam in the OR showed, Hemoperitoneum (800 mL), perforation of right  colon with serosal laceration up to the hepatic flexure, with left-sided diverticular disease chronic. He underwent right hemicolectomy and primary anastomosis.  He was transferred tot the floor after surgery and made slow steady progress.  He had a post op ileus, and he had a good bowel prep for his colonoscopy.   His wound was healing nicely and we advanced his diet as his bowel function returned.  By the day of admission he had had a second bowel movement, very liquid.  He was ready for discharge and went home on a regular diet.  His sutures will come out next week in the office and he can see Dr. Hassell Done in 2 weeks.  Condition on D/C:  Improved.  Disposition: 01-Home or Self Care   Future Appointments Provider Department Dept Phone   05/02/2013 3:30 PM [facility] Nurse [facility] [place] [phone]       Medication List         acetaminophen 325 MG tablet  Commonly known as:  TYLENOL  Take 2 tablets (650 mg total) by mouth every 6 (six) hours as needed for mild pain, moderate pain, fever or headache.     aspirin 81 MG tablet  Take 81 mg by mouth daily.     CLARITIN 10 MG tablet  Generic drug:  loratadine  Take 10 mg by mouth daily as needed for allergies.     esomeprazole 20 MG capsule  Commonly known as:  NEXIUM 24HR  Take 2 capsules (40 mg total) by mouth daily at 12 noon.     ibuprofen 200 MG tablet  Commonly known as:  ADVIL,MOTRIN  You can take 2-3 every 6 hours as needed for pain.     lisinopril 20 MG tablet  Commonly known as:  PRINIVIL,ZESTRIL  Take 1 tablet (20 mg total) by mouth daily.     MELATONIN ER PO  Take 1 capsule by mouth at bedtime.     oxyCODONE-acetaminophen 5-325 MG per tablet  Commonly known as:  PERCOCET/ROXICET  Take 1-2 tablets by mouth every 4 (four) hours as needed for moderate pain.     PRESERVISION AREDS 2 PO  Take 1 capsule by mouth 2 (two) times daily.     traMADol 50 MG tablet  Commonly known as:  ULTRAM  Take 1 tablet (50 mg total) by mouth every 6 (six) hours as needed for moderate pain.     Vitamin D-3 1000 UNITS Caps  Take 1 capsule by mouth daily as needed.           Follow-up Information   Schedule an appointment as soon as possible for a visit with [name], MD. (Make an appointment for 2 weeks.)     Specialty:  General Surgery   Contact information:   [address] [place] [address] [phone]       Follow up with [facility],MD, MD On 05/02/2013. (For staple removal at 3:30 PM be there 30 minutes early for check in.)    Specialty:  General Surgery   Contact information:   [place] [address] [phone]       Signed: Earnstine Regal 04/27/2013, 5:06 PM  Agree with above.  Alphonsa Overall, MD, [facility] Surgery Pager: [phone] Office phone:  [phone]

## 2013-04-27 NOTE — Discharge Instructions (Signed)
[facility]      [facility] [phone]  OPEN ABDOMINAL SURGERY: POST OP INSTRUCTIONS  Always review your discharge instruction sheet given to you by the facility where your surgery was performed.  IF YOU HAVE DISABILITY OR FAMILY LEAVE FORMS, YOU MUST BRING THEM TO THE OFFICE FOR PROCESSING.  PLEASE DO NOT GIVE THEM TO YOUR DOCTOR.  1. A prescription for pain medication may be given to you upon discharge.  Take your pain medication as prescribed, if needed.  If narcotic pain medicine is not needed, then you may take acetaminophen (Tylenol) or ibuprofen (Advil) as needed. 2. Take your usually prescribed medications unless otherwise directed. 3. If you need a refill on your pain medication, please contact your pharmacy. They will contact our office to request authorization.  Prescriptions will not be filled after 5pm or on week-ends. 4. You should follow a light diet the first few days after arrival home, such as soup and crackers, pudding, etc.unless your doctor has advised otherwise. A high-fiber, low fat diet can be resumed as tolerated.   Be sure to include lots of fluids daily. Most patients will experience some swelling and bruising on the chest and neck area.  Ice packs will help.  Swelling and bruising can take several days to resolve 5. Most patients will experience some swelling and bruising in the area of the incision. Ice pack will help. Swelling and bruising can take several days to resolve..  6. It is common to experience some constipation if taking pain medication after surgery.  Increasing fluid intake and taking a stool softener will usually help or prevent this problem from occurring.  A mild laxative (Milk of Magnesia or Miralax) should be taken according to package directions if there are no bowel movements after 48 hours. 7.  You may have steri-strips (small skin tapes) in place directly over the incision.  These strips should be left on the skin for 7-10 days.  If your  surgeon used skin glue on the incision, you may shower in 24 hours.  The glue will flake off over the next 2-3 weeks.  Any sutures or staples will be removed at the office during your follow-up visit. You may find that a light gauze bandage over your incision may keep your staples from being rubbed or pulled. You may shower and replace the bandage daily. 8. ACTIVITIES:  You may resume regular (light) daily activities beginning the next day--such as daily self-care, walking, climbing stairs--gradually increasing activities as tolerated.  You may have sexual intercourse when it is comfortable.  Refrain from any heavy lifting or straining until approved by your doctor. a. You may drive when you no longer are taking prescription pain medication, you can comfortably wear a seatbelt, and you can safely maneuver your car and apply brakes b. Return to Work: ___________________________________ 9. You should see your doctor in the office for a follow-up appointment approximately two weeks after your surgery.  Make sure that you call for this appointment within a day or two after you arrive home to insure a convenient appointment time. OTHER INSTRUCTIONS:  _____________________________________________________________ _____________________________________________________________  WHEN TO CALL YOUR DOCTOR: 1. Fever over 101.0 2. Inability to urinate 3. Nausea and/or vomiting 4. Extreme swelling or bruising 5. Continued bleeding from incision. 6. Increased pain, redness, or drainage from the incision. 7. Difficulty swallowing or breathing 8. Muscle cramping or spasms. 9. Numbness or tingling in hands or feet or around lips.  The clinic staff is available to   answer your questions during regular business hours.  Please don't hesitate to call and ask to speak to one of the nurses if you have concerns.  For further questions, please visit [email or web address]   

## 2013-04-27 NOTE — Progress Notes (Signed)
6 Days Post-Op  Subjective: Just had some stool, mostly loose liquid stuff.  He feels better.    Objective: Vital signs in last 24 hours: Temp:  [97.7 F (36.5 C)-97.8 F (36.6 C)] 97.7 F (36.5 C) (02/19 0519) Pulse Rate:  [86-110] 86 (02/19 0519) Resp:  [14-20] 14 (02/19 0519) BP: (121-143)/(77-88) 121/77 mmHg (02/19 0519) SpO2:  [96 %-100 %] 96 % (02/19 0519) Last BM Date: 04/26/13 1 BM recorded. Afebrile, BP up sone on and off. No labs Diet:  Full liquids Intake/Output from previous day: 02/18 0701 - 02/19 0700 In: 1639.3 [P.O.:1440; I.V.:99.3; IV Piggyback:100] Out: -  Intake/Output this shift:    General appearance: alert, cooperative and no distress Resp: clear to auscultation bilaterally GI: soft, minimal distension, + BS, some stool, incision looks good  Lab Results:  No results found for this basename: WBC, HGB, HCT, PLT,  in the last 72 hours  BMET No results found for this basename: NA, K, CL, CO2, GLUCOSE, BUN, CREATININE, CALCIUM,  in the last 72 hours PT/INR No results found for this basename: LABPROT, INR,  in the last 72 hours   Recent Labs Lab 04/21/13 1540  AST 25  ALT 19  ALKPHOS 47  BILITOT 0.3  PROT 6.6  ALBUMIN 3.9     Lipase  No results found for this basename: lipase     Studies/Results: No results found.  Medications: . esomeprazole  40 mg Oral Q1200  . feeding supplement (ENSURE COMPLETE)  237 mL Oral TID WC  . feeding supplement (RESOURCE BREEZE)  1 Container Oral TID BM  . heparin subcutaneous  5,000 Units Subcutaneous 3 times per day  . piperacillin-tazobactam (ZOSYN)  IV  3.375 g Intravenous Q8H    Assessment/Plan 1. Hemoperitoneum (800 mL), perforation of right colon with serosal laceration up to the hepatic flexure, with left-sided diverticular disease, chronic. 04/22/2013, Philip Earls, MD.   Full liquids   Zosyn - 2/13 >>>  2. Hx of diverticular disease  3. Esophageal stricture and hiatal hernia  4.  Hypertension  5. Tobacco use  6. Etoh use.  7. DVT proph - SQ Heparin   Plan:  Soft diet breakfast and lunch and if he does well home later today. D/c zosyn, recheck labs before d/c. Labs OK, he is tolerating a soft diet and wants to go home.  Labs this AM OK.  LOS: 6 days    Castillo,Philip 04/27/2013  Agree with above.  Philip Overall, MD, [facility] Pager: [phone] Office phone:  [phone]

## 2013-05-02 ENCOUNTER — Ambulatory Visit (INDEPENDENT_AMBULATORY_CARE_PROVIDER_SITE_OTHER): Payer: BC Managed Care – PPO

## 2013-05-02 ENCOUNTER — Encounter (INDEPENDENT_AMBULATORY_CARE_PROVIDER_SITE_OTHER): Payer: Self-pay

## 2013-05-02 NOTE — Progress Notes (Unsigned)
Patient in for nurse only staple removal; afebrile,no redness, odor,drainage noted; Cleansed abdomen with chlolra prep ; removal 17 staples, applied steri step glue , applied steri steps, patient tolerated well advised patient to call if temp 100.3 or greater, incision  drainage or odor or redness. Patient verbalized understanding.

## 2013-05-12 ENCOUNTER — Ambulatory Visit (INDEPENDENT_AMBULATORY_CARE_PROVIDER_SITE_OTHER): Payer: BC Managed Care – PPO | Admitting: Surgery

## 2013-05-12 ENCOUNTER — Encounter (INDEPENDENT_AMBULATORY_CARE_PROVIDER_SITE_OTHER): Payer: Self-pay | Admitting: Surgery

## 2013-05-12 VITALS — BP 138/82 | HR 80 | Temp 98.8°F | Resp 12 | Ht 71.5 in | Wt 160.8 lb

## 2013-05-12 DIAGNOSIS — K631 Perforation of intestine (nontraumatic): Secondary | ICD-10-CM

## 2013-05-12 MED ORDER — CHOLESTYRAMINE 4 GM/DOSE PO POWD: 2.0000 g | Freq: Three times a day (TID) | ORAL | Status: AC

## 2013-05-12 NOTE — Patient Instructions (Signed)
Avoid constipation in the future. You may need to use MiraLAX as needed. Included is a prescription for Questran which can help with post cholecystectomy diarrhea Vitamin supplements such as antioxidants (Vit C and E) and omega 3 fatty acids are recommended.

## 2013-05-12 NOTE — Progress Notes (Signed)
Philip Castillo 63 y.o.  Body mass index is 22.12 kg/(m^2).  Patient Active Problem List   Diagnosis Date Noted  . Hypertension   . Colon perforation s/p proximal colectomy 04/21/2013 04/21/2013  . DIVERTICULOSIS, COLON 11/23/2008  . 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 GERD 07/04/2007  . COLONIC POLYPS, HX OF 07/04/2007    No Known Allergies  Past Surgical History  Procedure Laterality Date  . Retinal detachment surgery  03/17/11 & 04/16/11     X2   . Cholecystectomy, laparoscopic  2006  . Colonoscopy w/ polypectomy  2002 ; 2004 & 2009    Dr Henrene Pastor; tubular adenoma & hyperplastic polyp  . Cataract extraction, bilateral    . Upper gastrointestinal endoscopy  2007    hiatal hernia; stricture   . Cholecystectomy    . Exploratory laparotomy  04/21/13    Dr. Hassell Done  . Laparotomy N/A 04/21/2013    Procedure: EXPLORATORY LAPAROTOMY /RIGHT HEMI-COLECTOMY;  Surgeon: [name], MD;  Location: [facility];  Service: General;  Laterality: N/A;   Unice Cobble, MD No diagnosis found.  Doing well after emergency right hemicolectomy for colonoscopic perforation. He has done very well after this surgery. His incision has healed nicely. We discussed management of his postcholecystectomy diarrhea and I called in a prescription for Questran. He knows to use this if needed. This will take the place of the Imodium that he has been taking which I think could complicate his sigmoid diverticulosis which is what I would consider pretty severe. I explained the rationale for doing a right hemicolectomy since his colon was split all the way up perforated the cecum.  He is doing very well and I can see him as needed. Return when necessary Matt B. Hassell Done, MD, [facility] [phone] beeper [phone]  05/12/2013 5:11 PM

## 2013-05-16 ENCOUNTER — Other Ambulatory Visit: Payer: Self-pay | Admitting: Internal Medicine

## 2013-05-22 ENCOUNTER — Telehealth: Payer: Self-pay | Admitting: *Deleted

## 2013-05-22 ENCOUNTER — Other Ambulatory Visit: Payer: Self-pay | Admitting: *Deleted

## 2013-05-22 MED ORDER — LISINOPRIL 20 MG PO TABS: ORAL_TABLET | ORAL | Status: DC

## 2013-05-22 NOTE — Telephone Encounter (Signed)
Patient requested that lisinopril be prescribed as a 90 day supply. New script sent to pharmacy. JG//CMA

## 2013-05-31 ENCOUNTER — Encounter (INDEPENDENT_AMBULATORY_CARE_PROVIDER_SITE_OTHER): Payer: BC Managed Care – PPO | Admitting: Surgery

## 2013-06-15 ENCOUNTER — Telehealth: Payer: Self-pay | Admitting: Internal Medicine

## 2013-06-15 NOTE — Telephone Encounter (Signed)
Just wanted to call [place] and see how he was making out. He has recovered nicely from surgery. His bowel habits are at baseline (tend to be loose). Saw Dr. Hassell Done last month with good postoperative check up. Does notice some upper abdominal discomfort which is vague. No other symptoms. I told him to contact me if he has any issues or needs any help.

## 2013-10-13 ENCOUNTER — Encounter: Payer: Self-pay | Admitting: Internal Medicine

## 2014-05-14 ENCOUNTER — Other Ambulatory Visit: Payer: Self-pay | Admitting: Internal Medicine

## 2014-05-23 ENCOUNTER — Encounter: Payer: Self-pay | Admitting: Internal Medicine

## 2014-12-13 IMAGING — CR DG CHEST 1V PORT
1 series · 1 of 1 positions shown · non-contrast
Comparison: None.

CLINICAL DATA: Distended abdomen after colonoscopy.

EXAM:
PORTABLE CHEST - 1 VIEW

[AP]
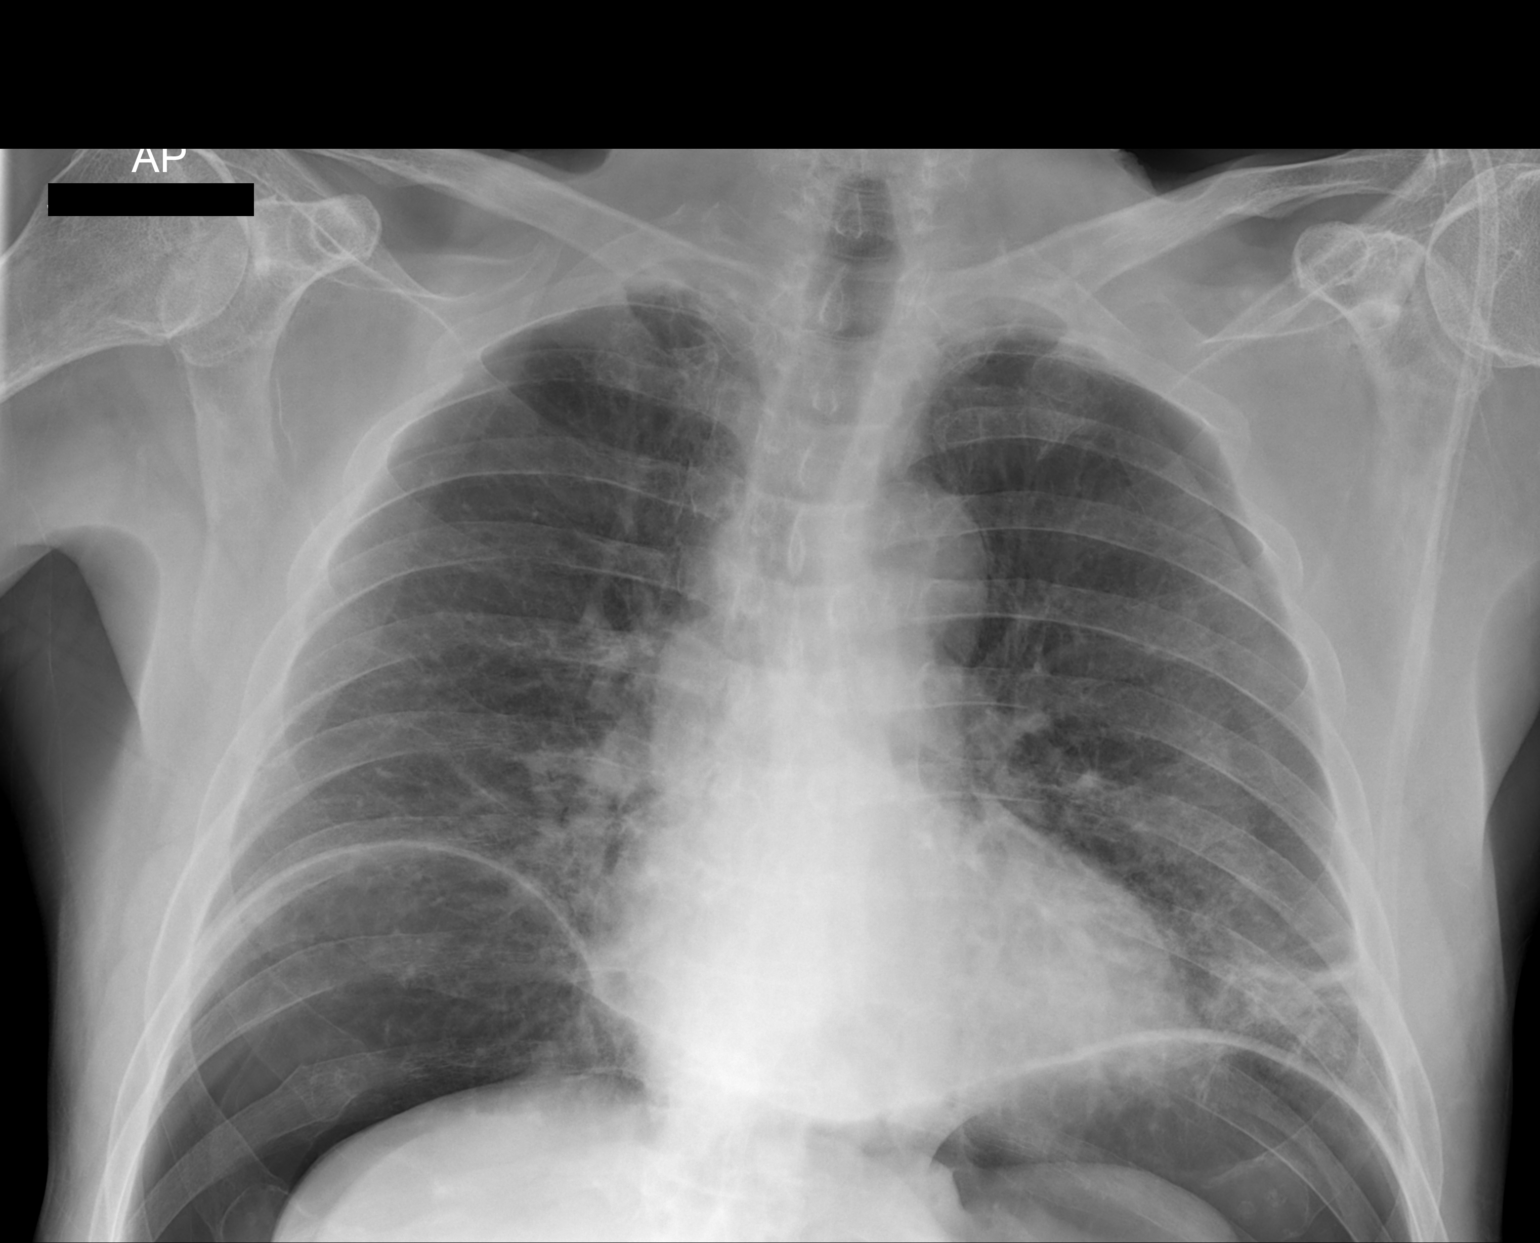

[1 of 1 positions shown; findings below may reference images not displayed]

FINDINGS: There is very prominent free air in the abdomen. There is slight
atelectasis at the left lung base. Heart size and vascularity are
normal. No acute osseous abnormality.
IMPRESSION: Extensive free air in the abdomen.

Critical Value/emergent results were called by telephone at the time
of interpretation on 04/21/2013 at [DATE] to Dr. KAKI JIM ,
who verbally acknowledged these results.
